# Patient Record
Sex: Female | Born: 1965
Health system: Southern US, Community
[De-identification: ages and names within clinical notes are randomized; demographics above are authoritative.]

## PROBLEM LIST (undated history)

## (undated) DIAGNOSIS — D649 Anemia, unspecified: Secondary | ICD-10-CM

## (undated) DIAGNOSIS — K649 Unspecified hemorrhoids: Secondary | ICD-10-CM

## (undated) DIAGNOSIS — N393 Stress incontinence (female) (male): Secondary | ICD-10-CM

## (undated) DIAGNOSIS — T7840XA Allergy, unspecified, initial encounter: Secondary | ICD-10-CM

## (undated) DIAGNOSIS — K219 Gastro-esophageal reflux disease without esophagitis: Secondary | ICD-10-CM

## (undated) HISTORY — PX: OTHER SURGICAL HISTORY: SHX169

## (undated) HISTORY — DX: Unspecified hemorrhoids: K64.9

## (undated) HISTORY — DX: Anemia, unspecified: D64.9

## (undated) HISTORY — PX: HEMORRHOID BANDING: SHX5850

## (undated) HISTORY — DX: Allergy, unspecified, initial encounter: T78.40XA

## (undated) HISTORY — PX: WISDOM TOOTH EXTRACTION: SHX21

## (undated) HISTORY — DX: Stress incontinence (female) (male): N39.3

## (undated) HISTORY — DX: Gastro-esophageal reflux disease without esophagitis: K21.9

---

## 1997-08-31 ENCOUNTER — Encounter: Admission: RE | Admit: 1997-08-31 | Discharge: 1997-11-10 | Payer: Self-pay | Admitting: *Deleted

## 1999-06-25 ENCOUNTER — Encounter (INDEPENDENT_AMBULATORY_CARE_PROVIDER_SITE_OTHER): Payer: Self-pay

## 1999-06-25 ENCOUNTER — Ambulatory Visit (HOSPITAL_COMMUNITY): Admission: RE | Admit: 1999-06-25 | Discharge: 1999-06-25 | Payer: Self-pay | Admitting: Obstetrics and Gynecology

## 2000-11-04 ENCOUNTER — Inpatient Hospital Stay (HOSPITAL_COMMUNITY): Admission: AD | Admit: 2000-11-04 | Discharge: 2000-11-06 | Payer: Self-pay | Admitting: Obstetrics and Gynecology

## 2000-12-12 ENCOUNTER — Other Ambulatory Visit: Admission: RE | Admit: 2000-12-12 | Discharge: 2000-12-12 | Payer: Self-pay | Admitting: Obstetrics and Gynecology

## 2002-08-29 ENCOUNTER — Encounter: Payer: Self-pay | Admitting: Obstetrics and Gynecology

## 2002-08-29 ENCOUNTER — Encounter: Admission: RE | Admit: 2002-08-29 | Discharge: 2002-08-29 | Payer: Self-pay | Admitting: Obstetrics and Gynecology

## 2003-07-21 ENCOUNTER — Encounter: Admission: RE | Admit: 2003-07-21 | Discharge: 2003-07-21 | Payer: Self-pay | Admitting: Obstetrics and Gynecology

## 2004-09-30 ENCOUNTER — Encounter: Admission: RE | Admit: 2004-09-30 | Discharge: 2004-09-30 | Payer: Self-pay | Admitting: Obstetrics and Gynecology

## 2005-03-23 ENCOUNTER — Encounter: Admission: RE | Admit: 2005-03-23 | Discharge: 2005-03-23 | Payer: Self-pay | Admitting: Obstetrics and Gynecology

## 2005-12-01 ENCOUNTER — Encounter: Admission: RE | Admit: 2005-12-01 | Discharge: 2005-12-01 | Payer: Self-pay | Admitting: Obstetrics and Gynecology

## 2006-08-08 ENCOUNTER — Encounter: Admission: RE | Admit: 2006-08-08 | Discharge: 2006-08-08 | Payer: Self-pay | Admitting: Obstetrics and Gynecology

## 2007-01-10 ENCOUNTER — Encounter: Admission: RE | Admit: 2007-01-10 | Discharge: 2007-01-10 | Payer: Self-pay | Admitting: Obstetrics and Gynecology

## 2007-08-28 ENCOUNTER — Ambulatory Visit: Payer: Self-pay | Admitting: Thoracic Surgery

## 2007-08-28 ENCOUNTER — Encounter: Admission: RE | Admit: 2007-08-28 | Discharge: 2007-08-28 | Payer: Self-pay | Admitting: Thoracic Surgery

## 2007-08-29 ENCOUNTER — Ambulatory Visit: Payer: Self-pay | Admitting: Thoracic Surgery

## 2008-02-03 ENCOUNTER — Encounter: Admission: RE | Admit: 2008-02-03 | Discharge: 2008-02-03 | Payer: Self-pay | Admitting: Obstetrics and Gynecology

## 2008-07-31 HISTORY — PX: TUMOR REMOVAL: SHX12

## 2009-04-06 ENCOUNTER — Encounter: Admission: RE | Admit: 2009-04-06 | Discharge: 2009-04-06 | Payer: Self-pay | Admitting: Obstetrics and Gynecology

## 2010-08-16 ENCOUNTER — Encounter
Admission: RE | Admit: 2010-08-16 | Discharge: 2010-08-16 | Payer: Self-pay | Source: Home / Self Care | Attending: Obstetrics and Gynecology | Admitting: Obstetrics and Gynecology

## 2010-12-13 NOTE — Letter (Signed)
August 28, 2007   Coletta Memos, M.D.  [address]  [address]  [place], [place] [address]   Re:  Dana Bernard, Dana Bernard               DOB:  Aug 12, 1965   Dear Dana Bernard:   I saw Dana Bernard today.  I appreciate the opportunity of seeing this  45 year old Caucasian female.  She was getting worked up for a chronic  cough, and chest x-ray showed a right lung mass.  CT scan revealed she  had a T5 posterior mediastinal tumor.  MRI shows a T5 posterior  mediastinal tumor, probably a schwannoma.  There is some impingement in  the vertebral in the neuroforamen.  She has no symptoms of paresthesias,  pain or any neurological weakness.  Her pulmonary symptom has been the  chronic cough for which she has seen an ENT and started on Protonix.  She also has been treated with an episode of steroids for possible  allergic bronchitis.   MEDICATIONS:  Her medications include Protonix.   ALLERGIES:  Prilosec causes a rash.   She was previously on another medication, which has been stopped.  She  has had no fever, chills, excessive sputum.   PAST MEDICAL HISTORY:  Significant that she has a left bundle branch  block.   FAMILY HISTORY:  Noncontributory.   SOCIAL HISTORY:  She is married and has two children.  Works as a Metallurgist.  Does not smoke, but drinks five or six glasses of wine per  week.   REVIEW OF SYSTEMS:  She weighs 127 Pounds.  She is 5 feet 7 inches.  CARDIAC:  No angina or atrial fibrillation.  PULMONARY:  See History of Present Illness.  GI:  No history of reflux.  No nausea, vomiting, constipation.  GU:  No kidney disease, dysuria or frequent urination.  VASCULAR:  No claudication, deep vein thrombosis, TIAs.  NEUROLOGICAL:  No headaches, blackouts or seizures.  MUSCULOSKELETAL:  No rash or joint pain.  PSYCHIATRIC:  No psychiatric illness.  EYES/ENT:  No change in her eyesight or her hearing.  HEMOLOGIC:  No positive bleeding or clotting disorders.   PHYSICAL EXAMINATION:  She  is a well-developed, Caucasian female in no  acute distress.  Her blood pressure is 128/86.  Pulse 80.  Respirations  18.  Saturations are 95%.  HEENT:  Unremarkable.  NECK:  Supple without thyromegaly.  There is no supraclavicular or axial  adenopathy.  CHEST:  Clear to auscultation and percussion.  HEART:  Regular sinus rhythm.  No murmur.  ABDOMEN:  Soft.  There is no hepatosplenomegaly.  EXTREMITIES:  Pulses 2+.  There is no clubbing or edema.  NEUROLOGICAL:  Intact.  SKIN:  Without lesions.   I reviewed the MRI and I am afraid there is a neurogenic impression.  This will probably require a combined approach with both a chest and  posterior approach.  I plan to get a CT scan of the chest before making  a final recommendation as I think the CT gives [place] another view of the  same situation.   I appreciate the opportunity of seeing Dana Bernard.   Ines Bloomer, M.D.  Electronically Signed   DPB/MEDQ  D:  08/28/2007  T:  08/28/2007  Job:  [id number]   cc:   Greer Pickerel, M.D.

## 2010-12-13 NOTE — Letter (Signed)
August 29, 2007   Coletta Memos, MD  [address]  [address]  [place], [place]  [address]   Re:  Dana, Bernard               DOB:  09-16-1965   Dear [name]:   Dana Bernard came today and we had a long discussion.  I reviewed her  CT scan which we got yesterday and also I reviewed her MRI and I feel  that she has a dumbbell-shaped schwannoma that I feel needs to be  removed with combined posterior and chest approach.  I have explained to  her in great detail my recommendations and will call you next week to  discuss it with you.  She is still trying to make up her mind where she  wants to have the surgery, but I did explain to her the risks of the  surgery including hemorrhage and paraparesis or  paraplegia, infection,  and pneumonia.  But I also explained to her that if she does not have  the surgery that she will eventually have problems with spinal cord  compression.  Again I will discuss this with you next week and then give  her a call.  Her blood pressure is 121/81, pulse 88, respirations 18.  Sats are 99%.   Ines Bloomer, M.D.  Electronically Signed   DPB/MEDQ  D:  08/29/2007  T:  08/29/2007  Job:  [id number]

## 2010-12-16 NOTE — H&P (Signed)
[facility]  Patient:    Dana Bernard, Dana Bernard                      MRN: [id number] Adm. Date:  11/04/00 Attending:  Lenoard Aden, M.D. [facility]   History and Physical  CHIEF COMPLAINT:              Postdates and presumed LGA, for induction.  HISTORY OF PRESENT ILLNESS:   The patient is a 45 year old white female, G5, P1-0-3-1, EDD October 28, 2000, at 41 weeks, who presents for induction for aforementioned reasons.  ALLERGIES:                    PRILOSEC.  MEDICATIONS:                  Prenatal vitamins.  OBSTETRICAL HISTORY:          Uncomplicated SAB in 1997.  Missed AB in 1992, with D&C.  A 6-pound 5-ounce female born in 57.  Spontaneous pregnancy loss in November 2000, with D&C.  The patients first term of pregnancy complicated pregnancy-induced hypertension.  GYNECOLOGICAL HISTORY:        Cryosurgery.  Previous pregnancy complicated by preterm labor.  Current pregnancy complicated by preterm cervical change without medications.  FAMILY HISTORY:               Asthma, hypertension, diabetes, lung cancer, and alcoholism.  PRENATAL LABORATORY DATA:     Blood type B positive, Rh antibody negative. Rubella immune.  Hepatitis B surface antigen negative.  HIV nonreactive. Group B strep negative.  Triple screen normal.  The patient had a 20-week ultrasound which was consistent with a normal anatomical survey, normal cervical length, and anterior placenta.  PHYSICAL EXAMINATION:  GENERAL:                      Well-developed, well-nourished white female in no apparent distress.  HEENT:                        Normal.  LUNGS:                        Clear.  HEART:                        Regular rhythm.  ABDOMEN:                      Soft, gravid, nontender.  Estimated fetal weight 8 pounds.  PELVIC:                       Cervix is 3 cm, 70%, vertex, -1 to 0 station. Artificial rupture of membranes  clear.  EXTREMITIES:                  No cords.  NEUROLOGIC:                   Nonfocal.  IMPRESSION:                   1. Postdates intrauterine pregnancy.                               2. Presumed large for gestational age.  3. History of postpartum hemorrhage.                               4. History of preterm labor.  PLAN:                         Proceed with artificial rupture of membranes, Pitocin augmentation if needed.  Epidural if needed.  Anticipated attempts at vaginal delivery. DD:  11/04/00 TD:  11/04/00 Job: [id number]

## 2010-12-16 NOTE — Op Note (Signed)
[facility]  Patient:    Dana Bernard                       MRN: [id number] Proc. Date: 06/25/99 Adm. Date:  [id number] Attending:  Lenoard Aden CC:         [facility]                           Operative Report  PREOPERATIVE DIAGNOSIS:       Fetal demise at 12 weeks.  POSTOPERATIVE DIAGNOSIS:      Fetal demise at 12 weeks.  OPERATION:                    Suction dilatation and curettage, examine under anesthesia.  SURGEON:                      Lenoard Aden, M.D.  ANESTHESIA:                   MAC and paracervical.  ESTIMATED BLOOD LOSS:         100 cc.  COMPLICATIONS:                None.  DRAINS:                       None.  COUNTS:                       Correct.  DISPOSITION:                  To recovery in good condition with no complications.  DESCRIPTION OF PROCEDURE:     After being apprised of the risks of anesthesia, infection, bleeding, uterine perforation, need for repair, the patient was brought to the operating room where she was administered a IV sedative without complications.  Prepped and draped in the usual sterile fashion.  Examination under anesthesia revealed a 12-week sized retroflexed uterus.  No adnexa masses at this time.  Paracervical block is placed after the patient is prepped and draped in the usual sterile fashion and catheterized until the bladder is emptied.  Then 20 cc of a 2% Xylocaine solution was used.  Uterus sounded to 15 cm, dilated up with a #36 dilator.  A 12 mm curved suction  curet was placed.  Suction revealing productive of conception as noted and sent to pathology for confirmation.  One curettage and four quadrant method confirms cavity to be emptied.  Repeat suction and curettage reconfirms good hemostasis noted.  All instruments were removed.  The patient tolerated the procedure well and goes to recovery in good condition. DD:  06/25/99 TD:  06/25/99 Job:  [id number]

## 2011-09-28 ENCOUNTER — Other Ambulatory Visit: Payer: Self-pay | Admitting: Family Medicine

## 2011-09-28 DIAGNOSIS — Z1231 Encounter for screening mammogram for malignant neoplasm of breast: Secondary | ICD-10-CM

## 2011-10-09 ENCOUNTER — Ambulatory Visit
Admission: RE | Admit: 2011-10-09 | Discharge: 2011-10-09 | Disposition: A | Discharge status: Home / Self Care | Payer: Commercial Indemnity | Source: Ambulatory Visit | Attending: Family Medicine | Admitting: Family Medicine

## 2011-10-09 DIAGNOSIS — Z1231 Encounter for screening mammogram for malignant neoplasm of breast: Secondary | ICD-10-CM

## 2011-11-09 DIAGNOSIS — R102 Pelvic and perineal pain unspecified side: Secondary | ICD-10-CM | POA: Insufficient documentation

## 2011-11-09 DIAGNOSIS — D166 Benign neoplasm of vertebral column: Secondary | ICD-10-CM | POA: Insufficient documentation

## 2012-09-20 ENCOUNTER — Other Ambulatory Visit: Payer: Self-pay | Admitting: Obstetrics and Gynecology

## 2012-10-18 ENCOUNTER — Ambulatory Visit
Admission: RE | Admit: 2012-10-18 | Discharge: 2012-10-18 | Disposition: A | Discharge status: Home / Self Care | Payer: Commercial Indemnity | Source: Ambulatory Visit | Attending: Obstetrics and Gynecology | Admitting: Obstetrics and Gynecology

## 2012-10-18 DIAGNOSIS — Z1231 Encounter for screening mammogram for malignant neoplasm of breast: Secondary | ICD-10-CM

## 2013-12-01 ENCOUNTER — Other Ambulatory Visit: Payer: Self-pay

## 2013-12-01 DIAGNOSIS — Z1231 Encounter for screening mammogram for malignant neoplasm of breast: Secondary | ICD-10-CM

## 2013-12-26 ENCOUNTER — Encounter (INDEPENDENT_AMBULATORY_CARE_PROVIDER_SITE_OTHER): Payer: Self-pay

## 2013-12-26 ENCOUNTER — Ambulatory Visit
Admission: RE | Admit: 2013-12-26 | Discharge: 2013-12-26 | Disposition: A | Discharge status: Home / Self Care | Payer: Commercial Indemnity | Source: Ambulatory Visit

## 2013-12-26 DIAGNOSIS — Z1231 Encounter for screening mammogram for malignant neoplasm of breast: Secondary | ICD-10-CM

## 2014-02-25 ENCOUNTER — Encounter: Payer: Self-pay | Admitting: Internal Medicine

## 2014-04-02 ENCOUNTER — Encounter: Payer: Self-pay | Admitting: Gastroenterology

## 2014-05-11 ENCOUNTER — Encounter: Payer: Self-pay | Admitting: Internal Medicine

## 2014-05-11 ENCOUNTER — Ambulatory Visit (INDEPENDENT_AMBULATORY_CARE_PROVIDER_SITE_OTHER): Payer: Commercial Indemnity | Admitting: Internal Medicine

## 2014-05-11 VITALS — BP 136/88 | HR 72 | Ht 67.0 in | Wt 157.8 lb

## 2014-05-11 DIAGNOSIS — K644 Residual hemorrhoidal skin tags: Secondary | ICD-10-CM

## 2014-05-11 DIAGNOSIS — Z1211 Encounter for screening for malignant neoplasm of colon: Secondary | ICD-10-CM

## 2014-05-11 DIAGNOSIS — K648 Other hemorrhoids: Secondary | ICD-10-CM

## 2014-05-11 NOTE — Assessment & Plan Note (Signed)
She will need a colonoscopy in early 2016, probably February. I believe her problems are related to symptomatic hemorrhoids I don't think a colonoscopy is needed at this time but definitely in early 2016.

## 2014-05-11 NOTE — Assessment & Plan Note (Signed)
Small anal tags Try Balneol, see response to banding - may shrink some - she has itching here mostly Advised using hair dryer after bathing

## 2014-05-11 NOTE — Patient Instructions (Signed)
HEMORRHOID BANDING PROCEDURE    FOLLOW-UP CARE   1. The procedure you have had should have been relatively painless since the banding of the area involved does not have nerve endings and there is no pain sensation.  The rubber band cuts off the blood supply to the hemorrhoid and the band may fall off as soon as 48 hours after the banding (the band may occasionally be seen in the toilet bowl following a bowel movement). You may notice a temporary feeling of fullness in the rectum which should respond adequately to plain Tylenol or Motrin.  2. Following the banding, avoid strenuous exercise that evening and resume full activity the next day.  A sitz bath (soaking in a warm tub) or bidet is soothing, and can be useful for cleansing the area after bowel movements.     3. To avoid constipation, take two tablespoons of natural wheat bran, natural oat bran, flax, Benefiber or any over the counter fiber supplement and increase your water intake to 7-8 glasses daily.    4. Unless you have been prescribed anorectal medication, do not put anything inside your rectum for two weeks: No suppositories, enemas, fingers, etc.  5. Occasionally, you may have more bleeding than usual after the banding procedure.  This is often from the untreated hemorrhoids rather than the treated one.  Don't be concerned if there is a tablespoon or so of blood.  If there is more blood than this, lie flat with your bottom higher than your head and apply an ice pack to the area. If the bleeding does not stop within a half an hour or if you feel faint, call our office at [phone] or go to the emergency room.  6. Problems are not common; however, if there is a substantial amount of bleeding, severe pain, chills, fever or difficulty passing urine (very rare) or other problems, you should call us at [phone] or report to the nearest emergency room.  7. Do not stay seated continuously for more than 2-3 hours for a day or two  after the procedure.  Tighten your buttock muscles 10-15 times every two hours and take 10-15 deep breaths every 1-2 hours.  Do not spend more than a few minutes on the toilet if you cannot empty your bowel; instead re-visit the toilet at a later time.  You have been scheduled for banding #2 on 06/02/14 at 2:15 pm. CC:  Jefm Petty MD

## 2014-05-11 NOTE — Assessment & Plan Note (Signed)
RP column banded RTC 2-4 weeks

## 2014-05-11 NOTE — Progress Notes (Signed)
Referred by Dr. Arvella Nigh Subjective:    Patient ID: Dana Bernard, female    DOB: 12-May-1966, 49 y.o.   MRN: [id number]  HPI Nice 48 year old married woman with a long history of hemorrhoids since childbirth, they intermittently bother her. She had an episode this summer with a lot of swelling and eructation and slight bleeding. For hemorrhoids tender prolapse. She moves her bowels regularly but not every day. Denies significant straining to stool. This summer she used hydrocortisone cream with some help but had persistent problems and asked to be seen about possible treatment of hemorrhoids here. Currently she is in these in shape with some occasional intermittent itching. No bleeding. She will turns 49 in January and would be due for a routine screening colonoscopy. Allergies  Allergen Reactions  . Prilosec [Omeprazole] Hives   Meds ordered this encounter  Medications  . venlafaxine XR (EFFEXOR-XR) 37.5 MG 24 hr capsule    Sig:   . PROCTOZONE-HC 2.5 % rectal cream    Sig:        Past Medical History  Diagnosis Date  . Hemorrhoids   . SUI (stress urinary incontinence, female)    Past Surgical History  Procedure Laterality Date  . Tumor removal  2010    of Spine-benign   History   Social History  . Marital Status: Married    Spouse Name: N/A    Number of Children: 2  . Years of Education: N/A   Occupational History  . Sales    Social History Main Topics  . Smoking status: Never Smoker   . Smokeless tobacco: Never Used  . Alcohol Use: Yes     Comment: 1 per day  . Drug Use: No  . Sexual Activity:  yes           Social History Narrative   Married, 1 son and 1 daughter   [employer], broadcasting/film/video dairy products   2 caffeine beverages daily   Family History  Problem Relation Age of Onset  . Brain cancer Father   . Stomach cancer Mother    Review of Systems All other ROS negative except for some stress urinary incontinence and intermittent vaginal dryness   Objective:   Physical Exam General:  Well-developed, well-nourished and in no acute distress Eyes:  anicteric. Abdomen:  soft, non-tender, no hepatosplenomegaly, hernia, or mass and BS+.  Rectal: With female staff present revealed small diffuse anal tag, otherwise normal anoderm and intact anal wink. There is no mass or rectocele. There is good resting and voluntary tone and squeeze and appropriate descent and simulated defecation along with appropriate abdominal contraction. Neuro:  A&O x 3.  Psych:  appropriate mood and  Affect.   Data Reviewed: Gynecology notes from February 2015 as well as phone call record of July 2015.  PROCEDURE NOTE: The patient presents with symptomatic grade 2  hemorrhoids, requesting rubber band ligation of his/her hemorrhoidal disease.  All risks, benefits and alternative forms of therapy were described and informed consent was obtained.  Lori Hvozdovic PA-C present for exams and procedures  In the Left Lateral Decubitus position anoscopic examination revealed grade 2 hemorrhoids in the all position(s).   The decision was made to band the RP internal hemorrhoid, and the [place] was used to perform band ligation without complication.  Digital anorectal examination was then performed to assure proper positioning of the band, and to adjust the banded tissue as required. 5% lidocaine and 0.125% NTG applied to alleviate sxs of  need to defecate also  The patient was discharged home without pain or other issues.  Dietary and behavioral recommendations were given and along with follow-up instructions.     The following adjunctive treatments were recommended:  Benefiber 1 tbsp daily and Balneol when necessary  The patient will return in 2-4 weeks for  follow-up and possible additional banding as required. No complications were encountered and the patient tolerated the procedure well.     Assessment & Plan:  Anal skin tags Small anal tags Try  Balneol, see response to banding - may shrink some - she has itching here mostly Advised using hair dryer after bathing  Hemorrhoids, internal, with bleeding, prolapse and itching RP column banded RTC 2-4 weeks  Colon cancer screening She will need a colonoscopy in early 2016, probably February. I believe her problems are related to symptomatic hemorrhoids I don't think a colonoscopy is needed at this time but definitely in early 2016.   I appreciate the opportunity to care for this patient. I will send a copy to Dr. Arvella Nigh and to Dr. Jefm Petty

## 2014-06-02 ENCOUNTER — Ambulatory Visit (INDEPENDENT_AMBULATORY_CARE_PROVIDER_SITE_OTHER): Payer: Commercial Indemnity | Admitting: Internal Medicine

## 2014-06-02 ENCOUNTER — Encounter: Payer: Self-pay | Admitting: Internal Medicine

## 2014-06-02 VITALS — BP 134/68 | HR 88 | Ht 67.0 in | Wt 158.0 lb

## 2014-06-02 DIAGNOSIS — K648 Other hemorrhoids: Secondary | ICD-10-CM

## 2014-06-02 NOTE — Progress Notes (Signed)
Patient ID: Dana Bernard, female   DOB: September 28, 1965, 48 y.o.   MRN: [id number]         PROCEDURE NOTE: The patient presents with symptomatic grade 2  hemorrhoids, requesting rubber band ligation of his/her hemorrhoidal disease.  All risks, benefits and alternative forms of therapy were described and informed consent was obtained.   The anorectum was pre-medicated with 0.125% NTG and lidocaine 5% The decision was made to band the LL internal hemorrhoid, and the [place] was used to perform band ligation without complication.  Digital anorectal examination was then performed to assure proper positioning of the band, and to adjust the banded tissue as required.  The patient was discharged home without pain or other issues.  Dietary and behavioral recommendations were given and along with follow-up instructions.     The following adjunctive treatments were recommended:  Balneol, Recticare prn  The patient will return in 2-4 weeks for  follow-up and possible additional banding as required. No complications were encountered and the patient tolerated the procedure well.

## 2014-06-02 NOTE — Patient Instructions (Addendum)
HEMORRHOID BANDING PROCEDURE    FOLLOW-UP CARE   1. The procedure you have had should have been relatively painless since the banding of the area involved does not have nerve endings and there is no pain sensation.  The rubber band cuts off the blood supply to the hemorrhoid and the band may fall off as soon as 48 hours after the banding (the band may occasionally be seen in the toilet bowl following a bowel movement). You may notice a temporary feeling of fullness in the rectum which should respond adequately to plain Tylenol or Motrin.  2. Following the banding, avoid strenuous exercise that evening and resume full activity the next day.  A sitz bath (soaking in a warm tub) or bidet is soothing, and can be useful for cleansing the area after bowel movements.     3. To avoid constipation, take two tablespoons of natural wheat bran, natural oat bran, flax, Benefiber or any over the counter fiber supplement and increase your water intake to 7-8 glasses daily.    4. Unless you have been prescribed anorectal medication, do not put anything inside your rectum for two weeks: No suppositories, enemas, fingers, etc.  5. Occasionally, you may have more bleeding than usual after the banding procedure.  This is often from the untreated hemorrhoids rather than the treated one.  Don't be concerned if there is a tablespoon or so of blood.  If there is more blood than this, lie flat with your bottom higher than your head and apply an ice pack to the area. If the bleeding does not stop within a half an hour or if you feel faint, call our office at [phone] or go to the emergency room.  6. Problems are not common; however, if there is a substantial amount of bleeding, severe pain, chills, fever or difficulty passing urine (very rare) or other problems, you should call us at [phone] or report to the nearest emergency room.  7. Do not stay seated continuously for more than 2-3 hours for a day or two  after the procedure.  Tighten your buttock muscles 10-15 times every two hours and take 10-15 deep breaths every 1-2 hours.  Do not spend more than a few minutes on the toilet if you cannot empty your bowel; instead re-visit the toilet at a later time.    We have made your next appointment for : November 20th, at 3:45pm.    I appreciate the opportunity to care for you.

## 2014-06-02 NOTE — Assessment & Plan Note (Addendum)
LL banded RTC 2-3 weeks

## 2014-06-19 ENCOUNTER — Encounter: Payer: Self-pay | Admitting: Internal Medicine

## 2014-06-19 ENCOUNTER — Ambulatory Visit (INDEPENDENT_AMBULATORY_CARE_PROVIDER_SITE_OTHER): Payer: Commercial Indemnity | Admitting: Internal Medicine

## 2014-06-19 VITALS — BP 126/68 | HR 84 | Ht 67.0 in | Wt 158.4 lb

## 2014-06-19 DIAGNOSIS — K648 Other hemorrhoids: Secondary | ICD-10-CM

## 2014-06-19 NOTE — Assessment & Plan Note (Signed)
RA banded She will observe over next 2 months If persistent sxs of itching and irritation she would benefit from surgical evaluation of the anal tags and external hemorrhoid component

## 2014-06-19 NOTE — Progress Notes (Signed)
Patient ID: Dana Bernard, female   DOB: 1966-03-15, 48 y.o.   MRN: [id number]          PROCEDURE NOTE: The patient presents with symptomatic grade 2  hemorrhoids, requesting rubber band ligation of his/her hemorrhoidal disease.  All risks, benefits and alternative forms of therapy were described and informed consent was obtained.   The anorectum was pre-medicated with 0.125% NTG and 5% lidocaine The decision was made to band the RA internal hemorrhoid, and the [place] was used to perform band ligation without complication.  Digital anorectal examination was then performed to assure proper positioning of the band, and to adjust the banded tissue as required.  The patient was discharged home without pain or other issues.  Dietary and behavioral recommendations were given and along with follow-up instructions.     The following adjunctive treatments were recommended:  Benefiber Balneol Recticare  The patient will return in as needed for follow-up and possible additional banding as required. No complications were encountered and the patient tolerated the procedure well.  She will call in 2016 to schedule a colonoscopy for screening   I appreciate the opportunity to care for this patient. Gatha Mayer, MD, [facility] [id number], Christian Mate, MD

## 2014-06-19 NOTE — Patient Instructions (Signed)
HEMORRHOID BANDING PROCEDURE    FOLLOW-UP CARE   1. The procedure you have had should have been relatively painless since the banding of the area involved does not have nerve endings and there is no pain sensation.  The rubber band cuts off the blood supply to the hemorrhoid and the band may fall off as soon as 48 hours after the banding (the band may occasionally be seen in the toilet bowl following a bowel movement). You may notice a temporary feeling of fullness in the rectum which should respond adequately to plain Tylenol or Motrin.  2. Following the banding, avoid strenuous exercise that evening and resume full activity the next day.  A sitz bath (soaking in a warm tub) or bidet is soothing, and can be useful for cleansing the area after bowel movements.     3. To avoid constipation, take two tablespoons of natural wheat bran, natural oat bran, flax, Benefiber or any over the counter fiber supplement and increase your water intake to 7-8 glasses daily.    4. Unless you have been prescribed anorectal medication, do not put anything inside your rectum for two weeks: No suppositories, enemas, fingers, etc.  5. Occasionally, you may have more bleeding than usual after the banding procedure.  This is often from the untreated hemorrhoids rather than the treated one.  Don't be concerned if there is a tablespoon or so of blood.  If there is more blood than this, lie flat with your bottom higher than your head and apply an ice pack to the area. If the bleeding does not stop within a half an hour or if you feel faint, call our office at [phone] or go to the emergency room.  6. Problems are not common; however, if there is a substantial amount of bleeding, severe pain, chills, fever or difficulty passing urine (very rare) or other problems, you should call us at [phone] or report to the nearest emergency room.  7. Do not stay seated continuously for more than 2-3 hours for a day or two  after the procedure.  Tighten your buttock muscles 10-15 times every two hours and take 10-15 deep breaths every 1-2 hours.  Do not spend more than a few minutes on the toilet if you cannot empty your bowel; instead re-visit the toilet at a later time.    Follow up with Dr. Carlean Purl as needed.  Call to set up a colonoscopy in 2016 when ready.    I appreciate the opportunity to care for you.

## 2014-09-14 ENCOUNTER — Encounter: Payer: Self-pay | Admitting: Internal Medicine

## 2015-02-12 ENCOUNTER — Other Ambulatory Visit: Payer: Self-pay | Admitting: Obstetrics and Gynecology

## 2015-02-12 ENCOUNTER — Other Ambulatory Visit: Payer: Self-pay

## 2015-02-12 DIAGNOSIS — Z1231 Encounter for screening mammogram for malignant neoplasm of breast: Secondary | ICD-10-CM

## 2015-03-18 ENCOUNTER — Ambulatory Visit
Admission: RE | Admit: 2015-03-18 | Discharge: 2015-03-18 | Disposition: A | Discharge status: Home / Self Care | Payer: Commercial Indemnity | Source: Ambulatory Visit

## 2015-03-18 DIAGNOSIS — Z1231 Encounter for screening mammogram for malignant neoplasm of breast: Secondary | ICD-10-CM

## 2015-09-08 ENCOUNTER — Encounter: Payer: Self-pay | Admitting: Internal Medicine

## 2015-10-25 ENCOUNTER — Ambulatory Visit (AMBULATORY_SURGERY_CENTER): Payer: Self-pay | Admitting: *Deleted

## 2015-10-25 VITALS — Ht 67.0 in | Wt 162.8 lb

## 2015-10-25 DIAGNOSIS — Z1211 Encounter for screening for malignant neoplasm of colon: Secondary | ICD-10-CM

## 2015-10-25 NOTE — Progress Notes (Signed)
No egg or soy allergy known to patient  No issues with past sedation with any surgeries  or procedures, no intubation problems  No diet pills per patient No home 02 use per patient  No blood thinners per patient  Pt denies issues with constipation  emmi declined

## 2015-11-08 ENCOUNTER — Encounter: Payer: Self-pay | Admitting: Internal Medicine

## 2015-11-08 ENCOUNTER — Ambulatory Visit (AMBULATORY_SURGERY_CENTER): Payer: Managed Care, Other (non HMO) | Admitting: Internal Medicine

## 2015-11-08 VITALS — BP 129/83 | HR 89 | Temp 98.0°F | Resp 14 | Ht 67.0 in | Wt 162.0 lb

## 2015-11-08 DIAGNOSIS — Z1211 Encounter for screening for malignant neoplasm of colon: Secondary | ICD-10-CM | POA: Diagnosis present

## 2015-11-08 MED ORDER — SODIUM CHLORIDE 0.9 % IV SOLN: 500.0000 mL | INTRAVENOUS | Status: DC

## 2015-11-08 NOTE — Progress Notes (Signed)
Report to PACU, RN, vss, BBS= Clear.  

## 2015-11-08 NOTE — Op Note (Signed)
[place] Patient Name: Dana Bernard Procedure Date: 11/08/2015 7:56 AM MRN: [id number] Endoscopist: Gatha Mayer , MD Age: 50 Date of Birth: 06-13-1966 Gender: Female Procedure:                Colonoscopy Indications:              Screening for colorectal malignant neoplasm Medicines:                Propofol per Anesthesia, Monitored Anesthesia Care Procedure:                Pre-Anesthesia Assessment:                           - Prior to the procedure, a History and Physical                            was performed, and patient medications and                            allergies were reviewed. The patient's tolerance of                            previous anesthesia was also reviewed. The risks                            and benefits of the procedure and the sedation                            options and risks were discussed with the patient.                            All questions were answered, and informed consent                            was obtained. Prior Anticoagulants: The patient has                            taken no previous anticoagulant or antiplatelet                            agents. ASA Grade Assessment: II - A patient with                            mild systemic disease. After reviewing the risks                            and benefits, the patient was deemed in                            satisfactory condition to undergo the procedure.                           After obtaining informed consent, the colonoscope  was passed under direct vision. Throughout the                            procedure, the patient's blood pressure, pulse, and                            oxygen saturations were monitored continuously. The                            Model PCF-H190L [id number]) scope was introduced                            through the anus and advanced to the the cecum,                            identified by appendiceal  orifice and ileocecal                            valve. The colonoscopy was performed without                            difficulty. The patient tolerated the procedure                            well. The quality of the bowel preparation was                            excellent. The bowel preparation used was Miralax.                            The ileocecal valve, appendiceal orifice, and                            rectum were photographed. Scope In: 8:15:07 AM Scope Out: 8:28:42 AM Scope Withdrawal Time: 0 hours 9 minutes 46 seconds  Total Procedure Duration: 0 hours 13 minutes 35 seconds  Findings:                 The perianal exam findings include skin tags.                           The digital rectal exam was normal.                           Non-bleeding external hemorrhoids were found during                            retroflexion. The hemorrhoids were small. Also saw                            scars from badning of internal hemorrhoids in past.                           The exam was otherwise without abnormality on  direct and retroflexion views. Complications:            No immediate complications. Estimated Blood Loss:     Estimated blood loss: none. Impression:               - Perianal skin tags found on perianal exam.                           - Non-bleeding external hemorrhoids and scars from                            prior banding of internal hemorrhoids.                           - The examination was otherwise normal on direct                            and retroflexion views.                           - No specimens collected. Recommendation:           - Patient has a contact number available for                            emergencies. The signs and symptoms of potential                            delayed complications were discussed with the                            patient. Return to normal activities tomorrow.                             Written discharge instructions were provided to the                            patient.                           - Continue present medications.                           - Resume previous diet.                           - Repeat colonoscopy in 10 years for screening                            purposes. Gatha Mayer, MD 11/08/2015 8:39:18 AM This report has been signed electronically. CC Letter to:             Arvella Nigh, MD, Jefm Petty, MD

## 2015-11-08 NOTE — Progress Notes (Signed)
Patient denies any allergies to eggs or soy. 

## 2015-11-08 NOTE — Patient Instructions (Addendum)
No polyps, no cancer seen. Next routine colonoscopy in 10 years - 2027  I appreciate the opportunity to care for you. Gatha Mayer, MD, FACG   YOU HAD AN ENDOSCOPIC PROCEDURE TODAY AT [facility]:   Refer to the procedure report that was given to you for any specific questions about what was found during the examination.  If the procedure report does not answer your questions, please call your gastroenterologist to clarify.  If you requested that your care partner not be given the details of your procedure findings, then the procedure report has been included in a sealed envelope for you to review at your convenience later.  YOU SHOULD EXPECT: Some feelings of bloating in the abdomen. Passage of more gas than usual.  Walking can help get rid of the air that was put into your GI tract during the procedure and reduce the bloating. If you had a lower endoscopy (such as a colonoscopy or flexible sigmoidoscopy) you may notice spotting of blood in your stool or on the toilet paper. If you underwent a bowel prep for your procedure, you may not have a normal bowel movement for a few days.  Please Note:  You might notice some irritation and congestion in your nose or some drainage.  This is from the oxygen used during your procedure.  There is no need for concern and it should clear up in a day or so.  SYMPTOMS TO REPORT IMMEDIATELY:   Following lower endoscopy (colonoscopy or flexible sigmoidoscopy):  Excessive amounts of blood in the stool  Significant tenderness or worsening of abdominal pains  Swelling of the abdomen that is new, acute  Fever of 100F or higher   For urgent or emergent issues, a gastroenterologist can be reached at any hour by calling [phone].   DIET: Your first meal following the procedure should be a small meal and then it is ok to progress to your normal diet. Heavy or fried foods are harder to digest and may make you feel nauseous or  bloated.  Likewise, meals heavy in dairy and vegetables can increase bloating.  Drink plenty of fluids but you should avoid alcoholic beverages for 24 hours.  ACTIVITY:  You should plan to take it easy for the rest of today and you should NOT DRIVE or use heavy machinery until tomorrow (because of the sedation medicines used during the test).    FOLLOW UP: Our staff will call the number listed on your records the next business day following your procedure to check on you and address any questions or concerns that you may have regarding the information given to you following your procedure. If we do not reach you, we will leave a message.  However, if you are feeling well and you are not experiencing any problems, there is no need to return our call.  We will assume that you have returned to your regular daily activities without incident.  If any biopsies were taken you will be contacted by phone or by letter within the next 1-3 weeks.  Please call us at [phone] if you have not heard about the biopsies in 3 weeks.    SIGNATURES/CONFIDENTIALITY: You and/or your care partner have signed paperwork which will be entered into your electronic medical record.  These signatures attest to the fact that that the information above on your After Visit Summary has been reviewed and is understood.  Full responsibility of the confidentiality of this discharge information lies  with you and/or your care-partner.  Thank-you for choosing [place] for your healthcare needs today.

## 2015-11-09 ENCOUNTER — Telehealth: Payer: Self-pay

## 2015-11-09 NOTE — Telephone Encounter (Signed)
  Follow up Call-  Call back number 11/08/2015  Post procedure Call Back phone  # [phone]  Permission to leave phone message Yes     Patient questions:  Do you have a fever, pain , or abdominal swelling? No. Pain Score  0 *  Have you tolerated food without any problems? Yes.    Have you been able to return to your normal activities? Yes.    Do you have any questions about your discharge instructions: Diet   No. Medications  No. Follow up visit  No.  Do you have questions or concerns about your Care? No.  Actions: * If pain score is 4 or above: No action needed, pain <4.  No problems per the pt. maw

## 2016-04-10 ENCOUNTER — Other Ambulatory Visit: Payer: Self-pay | Admitting: Obstetrics and Gynecology

## 2016-04-10 DIAGNOSIS — Z1231 Encounter for screening mammogram for malignant neoplasm of breast: Secondary | ICD-10-CM

## 2016-04-28 ENCOUNTER — Ambulatory Visit: Payer: Managed Care, Other (non HMO)

## 2016-05-08 ENCOUNTER — Ambulatory Visit
Admission: RE | Admit: 2016-05-08 | Discharge: 2016-05-08 | Disposition: A | Discharge status: Home / Self Care | Payer: Managed Care, Other (non HMO) | Source: Ambulatory Visit | Attending: Obstetrics and Gynecology | Admitting: Obstetrics and Gynecology

## 2016-05-08 DIAGNOSIS — Z1231 Encounter for screening mammogram for malignant neoplasm of breast: Secondary | ICD-10-CM

## 2017-05-01 ENCOUNTER — Other Ambulatory Visit: Payer: Self-pay | Admitting: Obstetrics and Gynecology

## 2017-05-01 DIAGNOSIS — Z1231 Encounter for screening mammogram for malignant neoplasm of breast: Secondary | ICD-10-CM

## 2017-05-14 ENCOUNTER — Ambulatory Visit
Admission: RE | Admit: 2017-05-14 | Discharge: 2017-05-14 | Disposition: A | Discharge status: Home / Self Care | Payer: Managed Care, Other (non HMO) | Source: Ambulatory Visit | Attending: Obstetrics and Gynecology | Admitting: Obstetrics and Gynecology

## 2017-05-14 DIAGNOSIS — Z1231 Encounter for screening mammogram for malignant neoplasm of breast: Secondary | ICD-10-CM

## 2018-08-20 ENCOUNTER — Other Ambulatory Visit: Payer: Self-pay | Admitting: Obstetrics and Gynecology

## 2018-08-20 DIAGNOSIS — Z1231 Encounter for screening mammogram for malignant neoplasm of breast: Secondary | ICD-10-CM

## 2018-09-12 ENCOUNTER — Ambulatory Visit
Admission: RE | Admit: 2018-09-12 | Discharge: 2018-09-12 | Disposition: A | Discharge status: Home / Self Care | Payer: Managed Care, Other (non HMO) | Source: Ambulatory Visit | Attending: Obstetrics and Gynecology | Admitting: Obstetrics and Gynecology

## 2018-09-12 DIAGNOSIS — Z1231 Encounter for screening mammogram for malignant neoplasm of breast: Secondary | ICD-10-CM

## 2019-09-02 ENCOUNTER — Encounter: Payer: Self-pay | Admitting: Internal Medicine

## 2019-09-02 ENCOUNTER — Ambulatory Visit: Payer: Managed Care, Other (non HMO) | Admitting: Internal Medicine

## 2019-09-02 VITALS — BP 112/74 | HR 88 | Temp 98.6°F | Ht 67.0 in | Wt 160.1 lb

## 2019-09-02 DIAGNOSIS — K644 Residual hemorrhoidal skin tags: Secondary | ICD-10-CM | POA: Diagnosis not present

## 2019-09-02 MED ORDER — HYDROCORTISONE ACETATE 25 MG RE SUPP: 25.0000 mg | Freq: Every evening | RECTAL | 0 refills | Status: DC | PRN

## 2019-09-02 NOTE — Patient Instructions (Signed)
Try taking Benefiber 1-2 tablespoons each day and keep fluid intake up.  I have prescribed hydrocortisone suppositories to use - try taking nightly for a few days and up to a week but I bet they will help within in a few days.  If these measures fail to take care of things let me know.  Long Term Prevention of Recurrent Hemorrhoids:   1. Fiber - Western diets are typically deficient in dietary fiber, and the addition of 15 - 20 gm. of fiber will help you have stools of a proper consistency, limiting your need to strain.  In addition to the use of raw oat or wheat bran, there are a number of commercial preparations that are available (Metamucil, Benefiber and Citrucel are just a few).    2. Fluids - It is important to have a sufficient amount of water intake during the day, in part to help the fiber "do its job".  Unless you have a medical condition that would prohibit it, a minimum of 6 - 8 glasses per day is important to help keep a regular bowel movement.  3. Do not strain - Many experts feel that chronic straining is one of the causes for the development of hemorrhoids.  Trying to limit yourself to two minutes on the commode may well limit your risk of recurrent hemorrhoids.  Also, do not try to "hold it" or avoid going to the bathroom when the urge is there.  These behavioral changes are thought to be very helpful in maintaining good bowel health.  I appreciate the opportunity to care for you. Gatha Mayer, MD, [facility]

## 2019-09-02 NOTE — Progress Notes (Signed)
Dana Bernard 54 y.o. 03-29-1966 [id number]  Assessment & Plan:   Encounter Diagnosis  Name Primary?  . Bleeding external hemorrhoids Yes     We will treat with Anusol HC suppositories as needed.  I explained how the most important underlying treatment is regular defecation increasing fiber fluids she may get enough of those but increasing fiber will help.  Long-term steroid use is not encouraged.  She understands.  However her flare should come down with this.  Banding is not indicated here.  She asked about surgery and I explained how that is painful and she has just such minimal changes here even though they are a nuisance I doubt these are operable necessarily and am confident that my surgical colleagues would recommend lifestyle and diet changes first.  She will see me as needed.  I appreciate the opportunity to care for this patient. CC: Dana Petty, MD  Subjective:   Chief Complaint: hemorrhoids  HPI Patient is here for follow-up having some rectal bleeding and wanting reassessment of her hemorrhoids, she is status post banding of these hemorrhoids with good result in 2015 and then screening colonoscopy in 2017 that was negative.  She had done well but over the last year she is seeing occasional bright red blood when she wipes.  Usually after multiple wipes.  No itching.  No discomfort.  She has external hemorrhoid anal tag complexes that persist and feel a bit swollen.  Occasionally gets constipated when she does not eat regularly.  She feels like she has good fluid intake.  She walks for exercise no cycling or exercises that could irritate the anal area occ constipation  She has been using some over-the-counter hydrocortisone cream on the outside of her anal area.   Allergies  Allergen Reactions  . Prilosec [Omeprazole] Hives   Current Meds  Medication Sig  . ibuprofen (ADVIL,MOTRIN) 200 MG tablet Take 200 mg by mouth every 6 (six) hours as needed.   .  Venlafaxine HCl 37.5 MG TB24 Take 37.5 mg by mouth daily with breakfast.    Past Medical History:  Diagnosis Date  . Allergy   . Anemia    fater 1st child, no recent  . GERD (gastroesophageal reflux disease)   . Hemorrhoids   . SUI (stress urinary incontinence, female)    pt denies   Past Surgical History:  Procedure Laterality Date  . HEMORRHOID BANDING    . tooth removal     as child with sedation  . TUMOR REMOVAL  2010   of Spine-benign  . VAGINAL DELIVERY     x2  . WISDOM TOOTH EXTRACTION     Social History   Social History Narrative   Married, 1 son and 1 daughter   Runner, broadcasting/film/video dairy products   2 caffeine beverages daily   family history includes Brain cancer in her father; Esophageal cancer in her paternal grandmother; Stomach cancer in her mother.   Review of Systems See HPI otherwise negative  Objective:   Physical Exam BP 112/74   Pulse 88   Temp 98.6 F (37 C)   Ht 5\' 7"  (1.702 m)   Wt 160 lb 2 oz (72.6 kg)   LMP 09/29/2015   BMI 25.08 kg/m  Well-developed well-nourished no acute distress  Dana Bernard, [name] present for rectal exam  Anoderm shows some small external hemorrhoid/tag complexes circumferential.  No dermatitis.  Digital rectal exam normal resting tone no mass nontender  Anoscopy is performed demonstrating grade 1 external  hemorrhoids in all positions with increased vascular pattern and mild inflammation.  Grade 1 noninflamed internal hemorrhoids all positions.

## 2019-09-10 ENCOUNTER — Ambulatory Visit (INDEPENDENT_AMBULATORY_CARE_PROVIDER_SITE_OTHER): Payer: Managed Care, Other (non HMO)

## 2019-09-10 ENCOUNTER — Encounter: Payer: Self-pay | Admitting: Orthopaedic Surgery

## 2019-09-10 ENCOUNTER — Other Ambulatory Visit: Payer: Self-pay

## 2019-09-10 ENCOUNTER — Ambulatory Visit: Payer: Managed Care, Other (non HMO) | Admitting: Orthopaedic Surgery

## 2019-09-10 DIAGNOSIS — M7711 Lateral epicondylitis, right elbow: Secondary | ICD-10-CM

## 2019-09-10 DIAGNOSIS — M25521 Pain in right elbow: Secondary | ICD-10-CM

## 2019-09-10 MED ORDER — METHYLPREDNISOLONE ACETATE 40 MG/ML IJ SUSP
40.0000 mg | INTRAMUSCULAR | Status: AC | PRN
  Administered 2019-09-10: 40 mg

## 2019-09-10 MED ORDER — LIDOCAINE HCL 1 % IJ SOLN
1.0000 mL | INTRAMUSCULAR | Status: AC | PRN
  Administered 2019-09-10: 1 mL

## 2019-09-10 NOTE — Progress Notes (Signed)
Office Visit Note   Patient: Dana Bernard           Date of Birth: 07/15/1966           MRN: [id number] Visit Date: 09/10/2019              Requested by: Lois Huxley, [place],  [place] [address] PCP: Lois Huxley, [place]   Assessment & Plan: Visit Diagnoses:  1. Pain in right elbow   2. Lateral epicondylitis, right elbow     Plan: Her signs and symptoms are consistent with lateral epicondylitis.  I recommended a steroid injection over this area as well as Voltaren gel and stretching exercises.  She agrees with this treatment plan.  I described the risk and benefits of steroid injections.  I did place it over the lateral epicondyle area without difficulty.  All question concerns were answered and addressed.  Follow-up can be as needed.  I would not hesitate to repeat injection in 6 to 8 weeks now if needed.  Follow-Up Instructions: Return if symptoms worsen or fail to improve.   Orders:  Orders Placed This Encounter  Procedures  . Hand/UE Inj  . XR Elbow Complete Right (3+View)   No orders of the defined types were placed in this encounter.     Procedures: Hand/UE Inj: R elbow for lateral epicondylitis on 09/10/2019 3:28 PM Medications: 1 mL lidocaine 1 %; 40 mg methylPREDNISolone acetate 40 MG/ML      Clinical Data: No additional findings.   Subjective: Chief Complaint  Patient presents with  . Right Elbow - Pain  Patient is a very pleasant 54 year old female who comes in with right elbow pain is been worsening for about 4 months now after she is doing housework shoveling rocks.  She fell like something pulled in her elbow.  She points to the lateral epicondylar area of the right elbow as a source of her pain.  She is right-hand dominant.  She said the area can be sensitive to the touch.  She has been taking ibuprofen and trying stretching exercises.  She is also tried Aspercreme and none of that is really helped.  She denies any injuries  to her shoulder wrist or hand on the right side.  She denies any numbness and tingling.  HPI  Review of Systems She currently denies any headache, chest pain, shortness of breath, fever, chills, nausea, vomiting  Objective: Vital Signs: LMP 09/29/2015   Physical Exam She is alert and orient x3 and in no acute distress Ortho Exam Examination of her right elbow shows that it is ligamentously stable.  The range of motion is full.  She is tender over the lateral epicondylar area and her signs and symptoms are consistent with lateral epicondylitis. Specialty Comments:  No specialty comments available.  Imaging: XR Elbow Complete Right (3+View)  Result Date: 09/10/2019 3 views of the right elbow show no acute findings.  The elbow is well located with no significant arthritic changes.    PMFS History: Patient Active Problem List   Diagnosis Date Noted  . Hemorrhoids, internal, with bleeding, prolapse and itching 05/11/2014  . Bleeding external hemorrhoids 05/11/2014  . Colon cancer screening 05/11/2014  . Benign neoplasm of spine 11/09/2011  . Vulvar pain 11/09/2011   Past Medical History:  Diagnosis Date  . Allergy   . Anemia    fater 1st child, no recent  . GERD (gastroesophageal reflux disease)   . Hemorrhoids  Family History  Problem Relation Age of Onset  . Brain cancer Father   . Stomach cancer Mother   . Esophageal cancer Paternal Grandmother        heavy smoker  . Colon cancer Neg Hx   . Colon polyps Neg Hx   . Rectal cancer Neg Hx     Past Surgical History:  Procedure Laterality Date  . HEMORRHOID BANDING    . tooth removal     as child with sedation  . TUMOR REMOVAL  2010   of Spine-benign  . VAGINAL DELIVERY     x2  . WISDOM TOOTH EXTRACTION     Social History   Occupational History  . Occupation: Scientist, clinical (histocompatibility and immunogenetics): [employer]  Tobacco Use  . Smoking status: Never Smoker  . Smokeless tobacco: Never Used  Substance and Sexual Activity  .  Alcohol use: Yes    Alcohol/week: 7.0 standard drinks    Types: 7 Standard drinks or equivalent per week    Comment: 1 per day  . Drug use: No  . Sexual activity: Not on file

## 2020-04-16 DIAGNOSIS — N94819 Vulvodynia, unspecified: Secondary | ICD-10-CM | POA: Diagnosis not present

## 2020-04-20 DIAGNOSIS — R030 Elevated blood-pressure reading, without diagnosis of hypertension: Secondary | ICD-10-CM | POA: Diagnosis not present

## 2020-06-11 DIAGNOSIS — R030 Elevated blood-pressure reading, without diagnosis of hypertension: Secondary | ICD-10-CM | POA: Diagnosis not present

## 2020-07-23 DIAGNOSIS — Z20822 Contact with and (suspected) exposure to covid-19: Secondary | ICD-10-CM | POA: Diagnosis not present

## 2020-07-23 DIAGNOSIS — U071 COVID-19: Secondary | ICD-10-CM | POA: Diagnosis not present

## 2020-09-22 DIAGNOSIS — L910 Hypertrophic scar: Secondary | ICD-10-CM | POA: Diagnosis not present

## 2020-09-22 DIAGNOSIS — L820 Inflamed seborrheic keratosis: Secondary | ICD-10-CM | POA: Diagnosis not present

## 2020-09-22 DIAGNOSIS — Z1283 Encounter for screening for malignant neoplasm of skin: Secondary | ICD-10-CM | POA: Diagnosis not present

## 2020-09-22 DIAGNOSIS — D225 Melanocytic nevi of trunk: Secondary | ICD-10-CM | POA: Diagnosis not present

## 2020-10-19 DIAGNOSIS — M549 Dorsalgia, unspecified: Secondary | ICD-10-CM | POA: Diagnosis not present

## 2020-11-19 DIAGNOSIS — N94819 Vulvodynia, unspecified: Secondary | ICD-10-CM | POA: Diagnosis not present

## 2021-02-17 ENCOUNTER — Other Ambulatory Visit: Payer: Self-pay | Admitting: Obstetrics and Gynecology

## 2021-02-17 DIAGNOSIS — Z1231 Encounter for screening mammogram for malignant neoplasm of breast: Secondary | ICD-10-CM

## 2021-02-21 ENCOUNTER — Ambulatory Visit
Admission: RE | Admit: 2021-02-21 | Discharge: 2021-02-21 | Disposition: A | Discharge status: Home / Self Care | Payer: BC Managed Care – PPO | Source: Ambulatory Visit | Attending: Obstetrics and Gynecology | Admitting: Obstetrics and Gynecology

## 2021-02-21 ENCOUNTER — Other Ambulatory Visit: Payer: Self-pay

## 2021-02-21 DIAGNOSIS — Z1231 Encounter for screening mammogram for malignant neoplasm of breast: Secondary | ICD-10-CM

## 2021-03-01 DIAGNOSIS — Z01419 Encounter for gynecological examination (general) (routine) without abnormal findings: Secondary | ICD-10-CM | POA: Diagnosis not present

## 2021-03-01 DIAGNOSIS — Z6826 Body mass index (BMI) 26.0-26.9, adult: Secondary | ICD-10-CM | POA: Diagnosis not present

## 2021-03-10 DIAGNOSIS — M79661 Pain in right lower leg: Secondary | ICD-10-CM | POA: Diagnosis not present

## 2021-03-10 DIAGNOSIS — R030 Elevated blood-pressure reading, without diagnosis of hypertension: Secondary | ICD-10-CM | POA: Diagnosis not present

## 2021-03-11 ENCOUNTER — Ambulatory Visit (HOSPITAL_BASED_OUTPATIENT_CLINIC_OR_DEPARTMENT_OTHER)
Admission: RE | Admit: 2021-03-11 | Discharge: 2021-03-11 | Disposition: A | Discharge status: Home / Self Care | Payer: BC Managed Care – PPO | Source: Ambulatory Visit | Attending: Physician Assistant | Admitting: Physician Assistant

## 2021-03-11 ENCOUNTER — Other Ambulatory Visit: Payer: Self-pay

## 2021-03-11 ENCOUNTER — Other Ambulatory Visit (HOSPITAL_BASED_OUTPATIENT_CLINIC_OR_DEPARTMENT_OTHER): Payer: Self-pay | Admitting: Physician Assistant

## 2021-03-11 DIAGNOSIS — Z87312 Personal history of (healed) stress fracture: Secondary | ICD-10-CM | POA: Diagnosis not present

## 2021-03-11 DIAGNOSIS — M79661 Pain in right lower leg: Secondary | ICD-10-CM | POA: Diagnosis not present

## 2021-03-16 ENCOUNTER — Other Ambulatory Visit: Payer: Self-pay | Admitting: Physician Assistant

## 2021-03-16 DIAGNOSIS — M79661 Pain in right lower leg: Secondary | ICD-10-CM

## 2021-03-24 DIAGNOSIS — Z13220 Encounter for screening for lipoid disorders: Secondary | ICD-10-CM | POA: Diagnosis not present

## 2021-03-24 DIAGNOSIS — N94819 Vulvodynia, unspecified: Secondary | ICD-10-CM | POA: Diagnosis not present

## 2021-03-24 DIAGNOSIS — Z0000 Encounter for general adult medical examination without abnormal findings: Secondary | ICD-10-CM | POA: Diagnosis not present

## 2021-03-24 DIAGNOSIS — G4700 Insomnia, unspecified: Secondary | ICD-10-CM | POA: Diagnosis not present

## 2021-03-27 ENCOUNTER — Ambulatory Visit
Admission: RE | Admit: 2021-03-27 | Discharge: 2021-03-27 | Disposition: A | Discharge status: Home / Self Care | Payer: BC Managed Care – PPO | Source: Ambulatory Visit | Attending: Physician Assistant | Admitting: Physician Assistant

## 2021-03-27 ENCOUNTER — Other Ambulatory Visit: Payer: Self-pay

## 2021-03-27 DIAGNOSIS — M79661 Pain in right lower leg: Secondary | ICD-10-CM

## 2021-09-21 ENCOUNTER — Ambulatory Visit: Payer: Self-pay

## 2021-09-21 ENCOUNTER — Ambulatory Visit: Payer: BC Managed Care – PPO | Admitting: Orthopaedic Surgery

## 2021-09-21 ENCOUNTER — Other Ambulatory Visit: Payer: Self-pay

## 2021-09-21 ENCOUNTER — Encounter: Payer: Self-pay | Admitting: Orthopaedic Surgery

## 2021-09-21 DIAGNOSIS — G8929 Other chronic pain: Secondary | ICD-10-CM | POA: Diagnosis not present

## 2021-09-21 DIAGNOSIS — M5441 Lumbago with sciatica, right side: Secondary | ICD-10-CM

## 2021-09-21 NOTE — Progress Notes (Signed)
Office Visit Note   Patient: Dana Bernard           Date of Birth: Aug 20, 1965           MRN: [id number] Visit Date: 09/21/2021              Requested by: Lois Huxley, [address],  [place] [address] PCP: Lois Huxley, [place]   Assessment & Plan: Visit Diagnoses:  1. Chronic right-sided low back pain with right-sided sciatica     Plan: Given the patient's waking pain and weakness on exam with extension of the left great toe against resistance recommend MRI to evaluate for HNP as a source of her radicular symptoms on the right also to evaluate for advanced facet arthritic changes resulting in stenosis versus facet arthritic changes as the source of her low back pain.  Have her follow-up with [place] after the MRI to go over results discuss further treatment.  Questions were encouraged and answered by Dr. Ninfa Linden and myself.  Follow-Up Instructions: Return AFTER MRI.   Orders:  Orders Placed This Encounter  Procedures   XR Lumbar Spine 2-3 Views   No orders of the defined types were placed in this encounter.     Procedures: No procedures performed   Clinical Data: No additional findings.   Subjective: Chief Complaint  Patient presents with   Lower Back - Pain    HPI Dana Bernard is a 56 year old female who is known to Dr. Ninfa Linden service comes in today with new complaint of low back pain.  She states that she has had low back pain for years at least 7 years.  This been worked up in the past and she has been to formal therapy.  She reports some 4 to 5 years ago she had low back pain and [facility] 6 weeks of therapy there and then went to pain management had 6 weeks of formal therapy there.  She continues to do Pilates which helps some.  However states her back pain is becoming worse it is waking her at night.  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  Ranks her pain to be 7 out of 10 pain at worst.  She  denies any radicular symptoms down the left leg.  Occasional radicular symptoms down the right leg to the knee.  No injury.  She has tried Advil which helps some but does not completely alleviate the pain.  She has had epidural steroid injection in the past which helped for short period of time.  Pain is worse with walking or standing.  Sitting does help with the low back pain. Review of Systems Positive for waking pain.  Negative for bowel bladder dysfunction negative for saddle anesthesia like symptoms.  Objective: Vital Signs: LMP 09/29/2015   Physical Exam Constitutional:      Appearance: She is not ill-appearing or diaphoretic.  Cardiovascular:     Pulses: Normal pulses.  Pulmonary:     Effort: Pulmonary effort is normal.  Neurological:     Mental Status: She is alert and oriented to person, place, and time.  Psychiatric:        Mood and Affect: Mood normal.    Ortho Exam Lumbar spine tenderness left lumbar lower paraspinous region.  Full extension flexion lumbar spine flexion causes discomfort lower spine.  5 out of 5 strength throughout the lower extremities except for extension of the  left great toe against resistance is 4 out of 5.  Sensation grossly intact bilateral feet to light touch.  Excellent range of motion bilateral hips without pain. Specialty Comments:  No specialty comments available.  Imaging: No results found.   PMFS History: Patient Active Problem List   Diagnosis Date Noted   Hemorrhoids, internal, with bleeding, prolapse and itching 05/11/2014   Bleeding external hemorrhoids 05/11/2014   Colon cancer screening 05/11/2014   Benign neoplasm of spine 11/09/2011   Vulvar pain 11/09/2011   Past Medical History:  Diagnosis Date   Allergy    Anemia    fater 1st child, no recent   GERD (gastroesophageal reflux disease)    Hemorrhoids     Family History  Problem Relation Age of Onset   Brain cancer Father    Stomach cancer Mother    Esophageal cancer  Paternal Grandmother        heavy smoker   Colon cancer Neg Hx    Colon polyps Neg Hx    Rectal cancer Neg Hx     Past Surgical History:  Procedure Laterality Date   HEMORRHOID BANDING     tooth removal     as child with sedation   TUMOR REMOVAL  2010   of Spine-benign   VAGINAL DELIVERY     x2   WISDOM TOOTH EXTRACTION     Social History   Occupational History   Occupation: Scientist, clinical (histocompatibility and immunogenetics): [employer]  Tobacco Use   Smoking status: Never   Smokeless tobacco: Never  Vaping Use   Vaping Use: Never used  Substance and Sexual Activity   Alcohol use: Yes    Alcohol/week: 7.0 standard drinks    Types: 7 Standard drinks or equivalent per week    Comment: 1 per day   Drug use: No   Sexual activity: Not on file

## 2021-09-21 NOTE — Addendum Note (Signed)
Addended by: Robyne Peers on: 09/21/2021 11:02 AM   Modules accepted: Orders

## 2021-10-08 ENCOUNTER — Other Ambulatory Visit: Payer: Self-pay

## 2021-10-08 ENCOUNTER — Ambulatory Visit
Admission: RE | Admit: 2021-10-08 | Discharge: 2021-10-08 | Disposition: A | Discharge status: Home / Self Care | Payer: BC Managed Care – PPO | Source: Ambulatory Visit | Attending: Physician Assistant | Admitting: Physician Assistant

## 2021-10-08 DIAGNOSIS — M5137 Other intervertebral disc degeneration, lumbosacral region: Secondary | ICD-10-CM | POA: Diagnosis not present

## 2021-10-08 DIAGNOSIS — G8929 Other chronic pain: Secondary | ICD-10-CM

## 2021-10-08 DIAGNOSIS — M545 Low back pain, unspecified: Secondary | ICD-10-CM | POA: Diagnosis not present

## 2021-10-11 ENCOUNTER — Encounter: Payer: Self-pay | Admitting: Physician Assistant

## 2021-10-11 ENCOUNTER — Ambulatory Visit (INDEPENDENT_AMBULATORY_CARE_PROVIDER_SITE_OTHER): Payer: BC Managed Care – PPO | Admitting: Physician Assistant

## 2021-10-11 DIAGNOSIS — M545 Low back pain, unspecified: Secondary | ICD-10-CM | POA: Diagnosis not present

## 2021-10-11 NOTE — Progress Notes (Signed)
HPI: This returns today for follow-up of her low back pain.  She states pain is worse at the end of the day.  She states she is okay first thing in the morning.  Pain is worse with state.  She continues to do Pilates.  She is taking ibuprofen but is worried about taking too much ibuprofen.  She denies any radicular symptoms down either leg.  She has had epidural steroid injections in her lumbar spine in the past which she states were beneficial.  She did not find therapy beneficial in the past. ?MRI of the lumbar spine dated 10/08/2021 is reviewed with the patient.  Left L4-5 annular fissure but no disc protrusion.  No spinal or foraminal stenosis.  L3-4 mild annular bulge and mild facet disease.  Degenerative disc disease at L5-S1 but no spinal or foraminal stenosis. ? ?Impression: Low back pain without radicular symptoms. ? ?Plan: Recommend return to therapy or epidural steroid injection.  She states again that therapy has not been beneficial for her in the past would like an epidural steroid injection.  Therefore we will send her for possible L4-5 facet injection with Dr. Ernestina Patches.  She will follow-up with [place] 4 weeks see what type of response she had to the injection.  Questions were encouraged and answered at length today. ?

## 2021-10-11 NOTE — Addendum Note (Signed)
Addended by: Robyne Peers on: 10/11/2021 01:53 PM ? ? Modules accepted: Orders ? ?

## 2021-10-19 ENCOUNTER — Telehealth: Payer: Self-pay | Admitting: Physical Medicine and Rehabilitation

## 2021-10-19 NOTE — Telephone Encounter (Signed)
Patient called. She would like an appointment with Dr. Newton.  

## 2021-11-21 ENCOUNTER — Encounter: Payer: Self-pay | Admitting: Physical Medicine and Rehabilitation

## 2021-11-21 ENCOUNTER — Ambulatory Visit: Payer: Self-pay

## 2021-11-21 ENCOUNTER — Ambulatory Visit: Payer: BC Managed Care – PPO | Admitting: Physical Medicine and Rehabilitation

## 2021-11-21 VITALS — BP 127/87 | HR 90

## 2021-11-21 DIAGNOSIS — M47816 Spondylosis without myelopathy or radiculopathy, lumbar region: Secondary | ICD-10-CM | POA: Diagnosis not present

## 2021-11-21 MED ORDER — METHYLPREDNISOLONE ACETATE 80 MG/ML IJ SUSP
80.0000 mg | Freq: Once | INTRAMUSCULAR | Status: AC
  Administered 2021-11-21: 80 mg

## 2021-11-21 NOTE — Progress Notes (Signed)
Pt state lower back pain that travels to her right buttock and posterior thigh. Pt state standing and bending makes the pain worse. Pt state she takes over the counter pain meds to help ease her pain. ? ?Numeric Pain Rating Scale and Functional Assessment ?Average Pain 7 ? ? ?In the last MONTH (on 0-10 scale) has pain interfered with the following? ? ?1. General activity like being  able to carry out your everyday physical activities such as walking, climbing stairs, carrying groceries, or moving a chair?  ?Rating(9) ? ? ?+Driver, -BT, -Dye Allergies. ? ?

## 2021-11-21 NOTE — Patient Instructions (Signed)

## 2021-11-30 ENCOUNTER — Ambulatory Visit: Payer: BC Managed Care – PPO | Admitting: Physician Assistant

## 2021-12-06 NOTE — Procedures (Signed)
Lumbar Facet Joint Intra-Articular Injection(s) with Fluoroscopic Guidance ? ?Patient: Dana Bernard      ?Date of Birth: February 02, 1966 ?MRN: [id number] ?PCP: Lois Huxley, PA      ?Visit Date: 11/21/2021 ?  ?Universal Protocol:    ?Date/Time: 11/21/2021 ? ?Consent Given By: the patient ? ?Position: PRONE  ? ?Additional Comments: ?Vital signs were monitored before and after the procedure. ?Patient was prepped and draped in the usual sterile fashion. ?The correct patient, procedure, and site was verified. ? ? ?Injection Procedure Details:  ?Procedure Site One ?Meds Administered:  ?Meds ordered this encounter  ?Medications  ? methylPREDNISolone acetate (DEPO-MEDROL) injection 80 mg  ?  ? ?Laterality: Bilateral ? ?Location/Site:  ?L4-L5 ? ?Needle size: 22 guage ? ?Needle type: Spinal ? ?Needle Placement: Articular ? ?Findings: ? -Comments: Excellent flow of contrast producing a partial arthrogram. ? ?Procedure Details: ?The fluoroscope beam is vertically oriented in AP, and the inferior recess is visualized beneath the lower pole of the inferior apophyseal process, which represents the target point for needle insertion. When direct visualization is difficult the target point is located at the medial projection of the vertebral pedicle. The region overlying each aforementioned target is locally anesthetized with a 1 to 2 ml. volume of 1% Lidocaine without Epinephrine.  ? ?The spinal needle was inserted into each of the above mentioned facet joints using biplanar fluoroscopic guidance. A 0.25 to 0.5 ml. volume of Isovue-250 was injected and a partial facet joint arthrogram was obtained. A single spot film was obtained of the resulting arthrogram.    One to 1.25 ml of the steroid/anesthetic solution was then injected into each of the facet joints noted above. ? ? ?Additional Comments:  ?The patient tolerated the procedure well ?Dressing: 2 x 2 sterile gauze and Band-Aid ?  ? ?Post-procedure details: ?Patient was observed  during the procedure. ?Post-procedure instructions were reviewed. ? ?Patient left the clinic in stable condition. ? ?

## 2021-12-06 NOTE — Progress Notes (Signed)
? ?[name] - 56 y.o. female MRN [id number]  Date of birth: 04-Nov-1965 ? ?Office Visit Note: ?Visit Date: 11/21/2021 ?PCP: Lois Huxley, PA ?Referred by: Pete Pelt, PA-C ? ?Subjective: ?Chief Complaint  ?Patient presents with  ? Lower Back - Pain  ? Right Thigh - Pain  ? Right Leg - Pain  ? ?HPI:  Dana Bernard is a 56 y.o. female who comes in today at the request of Benita Stabile, PA-C for planned Bilateral  L4-5 Lumbar facet/medial branch block with fluoroscopic guidance.  The patient has failed conservative care including home exercise, medications, time and activity modification.  This injection will be diagnostic and hopefully therapeutic.  Please see requesting physician notes for further details and justification.  Exam has shown concordant pain with facet joint loading. MRI reviewed with images and spine model.  MRI reviewed in the note below. ? ?She is getting some referral into the hip and thigh but nothing past the knee.  Depending on relief with diagnostic facet blocks could look at 1 epidural injection.  MRI does show some endplate reactive changes changes but no nerve compression or stenosis or narrowing. ? ? ? ? ?ROS Otherwise per HPI. ? ?Assessment & Plan: ?Visit Diagnoses:  ?  ICD-10-CM   ?1. Spondylosis without myelopathy or radiculopathy, lumbar region  M47.816 XR C-ARM NO REPORT  ?  Facet Injection  ?  methylPREDNISolone acetate (DEPO-MEDROL) injection 80 mg  ?  ?  ?Plan: No additional findings.  ? ?Meds & Orders:  ?Meds ordered this encounter  ?Medications  ? methylPREDNISolone acetate (DEPO-MEDROL) injection 80 mg  ?  ?Orders Placed This Encounter  ?Procedures  ? Facet Injection  ? XR C-ARM NO REPORT  ?  ?Follow-up: Return if symptoms worsen or fail to improve.  ? ?Procedures: ?No procedures performed  ?Lumbar Facet Joint Intra-Articular Injection(s) with Fluoroscopic Guidance ? ?Patient: Dana Bernard      ?Date of Birth: 09/06/1965 ?MRN: [id number] ?PCP: Lois Huxley,  PA      ?Visit Date: 11/21/2021 ?  ?Universal Protocol:    ?Date/Time: 11/21/2021 ? ?Consent Given By: the patient ? ?Position: PRONE  ? ?Additional Comments: ?Vital signs were monitored before and after the procedure. ?Patient was prepped and draped in the usual sterile fashion. ?The correct patient, procedure, and site was verified. ? ? ?Injection Procedure Details:  ?Procedure Site One ?Meds Administered:  ?Meds ordered this encounter  ?Medications  ? methylPREDNISolone acetate (DEPO-MEDROL) injection 80 mg  ?  ? ?Laterality: Bilateral ? ?Location/Site:  ?L4-L5 ? ?Needle size: 22 guage ? ?Needle type: Spinal ? ?Needle Placement: Articular ? ?Findings: ? -Comments: Excellent flow of contrast producing a partial arthrogram. ? ?Procedure Details: ?The fluoroscope beam is vertically oriented in AP, and the inferior recess is visualized beneath the lower pole of the inferior apophyseal process, which represents the target point for needle insertion. When direct visualization is difficult the target point is located at the medial projection of the vertebral pedicle. The region overlying each aforementioned target is locally anesthetized with a 1 to 2 ml. volume of 1% Lidocaine without Epinephrine.  ? ?The spinal needle was inserted into each of the above mentioned facet joints using biplanar fluoroscopic guidance. A 0.25 to 0.5 ml. volume of Isovue-250 was injected and a partial facet joint arthrogram was obtained. A single spot film was obtained of the resulting arthrogram.    One to 1.25 ml of the steroid/anesthetic solution was then injected into each of  the facet joints noted above. ? ? ?Additional Comments:  ?The patient tolerated the procedure well ?Dressing: 2 x 2 sterile gauze and Band-Aid ?  ? ?Post-procedure details: ?Patient was observed during the procedure. ?Post-procedure instructions were reviewed. ? ?Patient left the clinic in stable condition. ?  ? ?Clinical History: ?MRI LUMBAR SPINE WITHOUT  CONTRAST ?  ?TECHNIQUE: ?Multiplanar, multisequence MR imaging of the lumbar spine was ?performed. No intravenous contrast was administered. ?  ?COMPARISON:  Radiographs 09/21/2021 ?  ?FINDINGS: ?Segmentation: There are five lumbar type vertebral bodies. The last ?full intervertebral disc space is labeled L5-S1. This correlates ?with the lumbar radiographs. ?  ?Alignment:  Normal ?  ?Vertebrae: No bone lesions or fractures. Small hemangiomas noted. ?Endplate reactive changes noted at L5-S1. ?  ?Conus medullaris and cauda equina: Conus extends to the T12 level. ?Conus and cauda equina appear normal. ?  ?Paraspinal and other soft tissues: No significant paraspinal or ?retroperitoneal findings. ?  ?Disc levels: ?  ?L1-2: No significant findings. ?  ?L2-3: Mild facet disease with slight ligamentum flavum thickening ?but no disc protrusions, spinal or foraminal stenosis. ?  ?L3-4: Mild annular bulge and mild facet disease with early lateral ?recess encroachment. No spinal or foraminal stenosis. ?  ?L4-5: Left-sided annular fissure but no disc protrusion, spinal or ?foraminal stenosis. Mild facet disease. ?  ?L5-S1: Degenerative disc disease with disc space narrowing and disc ?desiccation along with endplate reactive changes. No disc ?protrusions, spinal or foraminal stenosis. ?  ?IMPRESSION: ?1. Left-sided annular fissure at L4-5 but no disc protrusion, spinal ?or foraminal stenosis. ?2. Early lateral recess encroachment at L3-4. ?3. Degenerative disc disease at L5-S1 but no spinal or foraminal ?stenosis. ?  ?  ?Electronically Signed ?  By: Marijo Sanes M.D. ?  On: 10/08/2021 19:47  ? ? ? ?Objective:  VS:  HT:    WT:   BMI:     BP:127/87  HR:90bpm  TEMP: ( )  RESP:  ?Physical Exam ?Vitals and nursing note reviewed.  ?Constitutional:   ?   General: She is not in acute distress. ?   Appearance: Normal appearance. She is not ill-appearing.  ?HENT:  ?   Head: Normocephalic and atraumatic.  ?   Right Ear: External ear  normal.  ?   Left Ear: External ear normal.  ?Eyes:  ?   Extraocular Movements: Extraocular movements intact.  ?Cardiovascular:  ?   Rate and Rhythm: Normal rate.  ?   Pulses: Normal pulses.  ?Pulmonary:  ?   Effort: Pulmonary effort is normal. No respiratory distress.  ?Abdominal:  ?   General: There is no distension.  ?   Palpations: Abdomen is soft.  ?Musculoskeletal:     ?   General: Tenderness present.  ?   Cervical back: Neck supple.  ?   Right lower leg: No edema.  ?   Left lower leg: No edema.  ?   Comments: Patient has good distal strength with no pain over the greater trochanters.  No clonus or focal weakness.  ?Skin: ?   Findings: No erythema, lesion or rash.  ?Neurological:  ?   General: No focal deficit present.  ?   Mental Status: She is alert and oriented to person, place, and time.  ?   Sensory: No sensory deficit.  ?   Motor: No weakness or abnormal muscle tone.  ?   Coordination: Coordination normal.  ?Psychiatric:     ?   Mood and Affect: Mood normal.     ?  Behavior: Behavior normal.  ?  ? ?Imaging: ?No results found. ?

## 2021-12-21 ENCOUNTER — Encounter: Payer: Self-pay | Admitting: Physician Assistant

## 2021-12-21 ENCOUNTER — Ambulatory Visit: Payer: BC Managed Care – PPO | Admitting: Physician Assistant

## 2021-12-21 DIAGNOSIS — M545 Low back pain, unspecified: Secondary | ICD-10-CM

## 2021-12-21 NOTE — Progress Notes (Signed)
HPI: Dana Bernard for returns today status post L4-5 articular epidural steroid injection by Dr. Ernestina Patches on 11/21/2021.  States the right side pain is much improved no longer having any radicular symptoms down either leg.  Her main complaint today though is left lower paraspinous pain.  She is having no radicular symptoms down the the left side.  Pains worse with prolonged standing.  She has continued to do Pilates and has began doing the elliptical on exercise bike.  She was takes occasional Advil and this helps some.  She has had no waking pain.  States the pains been present for at least the past couple weeks. MRI did show a left-sided annular fissure at L4-5 but no disc protrusion spinal or foraminal stenosis.  Early lateral recess encroachment at L3-4 without stenosis.  Degenerative disc disease at L5-S1 but again no stenosis. Review of systems: See HPI otherwise negative.  Physical exam: General well-developed well-nourished female no acute distress ambulates without any assistive device. Bilateral lower extremity she has 5 and 5 strength throughout lower extremities against resistance.  Tight hamstrings bilaterally.  Positive straight leg raise on the right negative on the left.   Impression: Low back pain without radicular symptoms   Plan: We will send her for possible left L3-4 facet injection with Dr. Ernestina Patches.  Have her follow-up with [place] pain persist or becomes worse.  She will continue Pilates and core strengthening.  Questions were encouraged and answered

## 2021-12-21 NOTE — Addendum Note (Signed)
Addended by: Robyne Peers on: 12/21/2021 04:58 PM   Modules accepted: Orders

## 2021-12-30 ENCOUNTER — Telehealth: Payer: Self-pay | Admitting: Physical Medicine and Rehabilitation

## 2021-12-30 NOTE — Telephone Encounter (Signed)
Patient returned call asked for a call back. Phone # [phone]

## 2021-12-30 NOTE — Telephone Encounter (Signed)
Patient called needing to schedule an appointment with Dr. Ernestina Patches for her back injection. The number to contact patient is [phone]

## 2022-01-09 ENCOUNTER — Telehealth: Payer: Self-pay | Admitting: Orthopaedic Surgery

## 2022-01-09 ENCOUNTER — Other Ambulatory Visit: Payer: Self-pay | Admitting: Orthopaedic Surgery

## 2022-01-09 MED ORDER — TIZANIDINE HCL 4 MG PO TABS: 4.0000 mg | ORAL_TABLET | Freq: Three times a day (TID) | ORAL | 0 refills | Status: DC | PRN

## 2022-01-09 NOTE — Telephone Encounter (Signed)
Patient called advised she hurt her back last week and asked if Dr. Ninfa Linden would have a muscle relaxer called into her pharmacy? Patient uses  [facility] in [place]. The number to contact patient is [phone]

## 2022-01-24 ENCOUNTER — Ambulatory Visit: Payer: Self-pay

## 2022-01-24 ENCOUNTER — Ambulatory Visit: Payer: BC Managed Care – PPO | Admitting: Physical Medicine and Rehabilitation

## 2022-01-24 ENCOUNTER — Encounter: Payer: Self-pay | Admitting: Physical Medicine and Rehabilitation

## 2022-01-24 VITALS — BP 128/84 | HR 74

## 2022-01-24 DIAGNOSIS — M47816 Spondylosis without myelopathy or radiculopathy, lumbar region: Secondary | ICD-10-CM

## 2022-01-24 MED ORDER — BUPIVACAINE HCL 0.5 % IJ SOLN
3.0000 mL | Freq: Once | INTRAMUSCULAR | Status: AC
  Administered 2022-01-24: 3 mL

## 2022-01-24 NOTE — Progress Notes (Signed)
ROSENA BARTLE - 56 y.o. female MRN [id number]  Date of birth: Jun 11, 1966  Office Visit Note: Visit Date: 01/24/2022 PCP: Lois Huxley, PA Referred by: Lois Huxley, PA  Subjective: Chief Complaint  Patient presents with   Lower Back - Pain   HPI:  AERABELLA GALASSO is a 56 y.o. female who comes in today for planned repeat Left L3-4 and L4-5 Lumbar facet/medial branch block with fluoroscopic guidance.  The patient has failed conservative care including home exercise, medications, time and activity modification.  This injection will be diagnostic and hopefully therapeutic.  Please see requesting physician notes for further details and justification.  Exam shows concordant low back pain with facet joint loading and extension. Patient received more than 80% pain relief from prior injection.     Referring:Gil Clark, PA-C   ROS Otherwise per HPI.  Assessment & Plan: Visit Diagnoses:    ICD-10-CM   1. Spondylosis without myelopathy or radiculopathy, lumbar region  M47.816 XR C-ARM NO REPORT    Facet Injection    bupivacaine (MARCAINE) 0.5 % (with pres) injection 3 mL      Plan: No additional findings.   Meds & Orders:  Meds ordered this encounter  Medications   bupivacaine (MARCAINE) 0.5 % (with pres) injection 3 mL    Orders Placed This Encounter  Procedures   Facet Injection   XR C-ARM NO REPORT    Follow-up: Return for visit to requesting provider as needed.   Procedures: No procedures performed  Lumbar Facet Joint Intra-Articular Injection(s) with Fluoroscopic Guidance  Patient: JOANMARIE TSANG      Date of Birth: January 19, 1966 MRN: [id number] PCP: Lois Huxley, PA      Visit Date: 01/24/2022   Universal Protocol:    Date/Time: 01/24/2022  Consent Given By: the patient  Position: PRONE   Additional Comments: Vital signs were monitored before and after the procedure. Patient was prepped and draped in the usual sterile fashion. The correct patient,  procedure, and site was verified.   Injection Procedure Details:  Procedure Site One Meds Administered:  Meds ordered this encounter  Medications   bupivacaine (MARCAINE) 0.5 % (with pres) injection 3 mL     Laterality: Left  Location/Site:  L3-L4 L4-L5  Needle size: 22 guage  Needle type: Spinal  Needle Placement: Articular  Findings:  -Comments: Excellent flow of contrast producing a partial arthrogram.  Procedure Details: The fluoroscope beam is vertically oriented in AP, and the inferior recess is visualized beneath the lower pole of the inferior apophyseal process, which represents the target point for needle insertion. When direct visualization is difficult the target point is located at the medial projection of the vertebral pedicle. The region overlying each aforementioned target is locally anesthetized with a 1 to 2 ml. volume of 1% Lidocaine without Epinephrine.   The spinal needle was inserted into each of the above mentioned facet joints using biplanar fluoroscopic guidance. A 0.25 to 0.5 ml. volume of Isovue-250 was injected and a partial facet joint arthrogram was obtained. A single spot film was obtained of the resulting arthrogram.    One to 1.25 ml of the steroid/anesthetic solution was then injected into each of the facet joints noted above.   Additional Comments:  The patient tolerated the procedure well Dressing: 2 x 2 sterile gauze and Band-Aid    Post-procedure details: Patient was observed during the procedure. Post-procedure instructions were reviewed.  Patient left the clinic in stable condition.  Clinical History: MRI LUMBAR SPINE WITHOUT CONTRAST   TECHNIQUE: Multiplanar, multisequence MR imaging of the lumbar spine was performed. No intravenous contrast was administered.   COMPARISON:  Radiographs 09/21/2021   FINDINGS: Segmentation: There are five lumbar type vertebral bodies. The last full intervertebral disc space is labeled  L5-S1. This correlates with the lumbar radiographs.   Alignment:  Normal   Vertebrae: No bone lesions or fractures. Small hemangiomas noted. Endplate reactive changes noted at L5-S1.   Conus medullaris and cauda equina: Conus extends to the T12 level. Conus and cauda equina appear normal.   Paraspinal and other soft tissues: No significant paraspinal or retroperitoneal findings.   Disc levels:   L1-2: No significant findings.   L2-3: Mild facet disease with slight ligamentum flavum thickening but no disc protrusions, spinal or foraminal stenosis.   L3-4: Mild annular bulge and mild facet disease with early lateral recess encroachment. No spinal or foraminal stenosis.   L4-5: Left-sided annular fissure but no disc protrusion, spinal or foraminal stenosis. Mild facet disease.   L5-S1: Degenerative disc disease with disc space narrowing and disc desiccation along with endplate reactive changes. No disc protrusions, spinal or foraminal stenosis.   IMPRESSION: 1. Left-sided annular fissure at L4-5 but no disc protrusion, spinal or foraminal stenosis. 2. Early lateral recess encroachment at L3-4. 3. Degenerative disc disease at L5-S1 but no spinal or foraminal stenosis.     Electronically Signed   By: Marijo Sanes M.D.   On: 10/08/2021 19:47     Objective:  VS:  HT:    WT:   BMI:     BP:128/84  HR:74bpm  TEMP: ( )  RESP:  Physical Exam Vitals and nursing note reviewed.  Constitutional:      General: She is not in acute distress.    Appearance: Normal appearance. She is not ill-appearing.  HENT:     Head: Normocephalic and atraumatic.     Right Ear: External ear normal.     Left Ear: External ear normal.  Eyes:     Extraocular Movements: Extraocular movements intact.  Cardiovascular:     Rate and Rhythm: Normal rate.     Pulses: Normal pulses.  Pulmonary:     Effort: Pulmonary effort is normal. No respiratory distress.  Abdominal:     General: There is  no distension.     Palpations: Abdomen is soft.  Musculoskeletal:        General: Tenderness present.     Cervical back: Neck supple.     Right lower leg: No edema.     Left lower leg: No edema.     Comments: Patient has good distal strength with no pain over the greater trochanters.  No clonus or focal weakness.  Skin:    Findings: No erythema, lesion or rash.  Neurological:     General: No focal deficit present.     Mental Status: She is alert and oriented to person, place, and time.     Sensory: No sensory deficit.     Motor: No weakness or abnormal muscle tone.     Coordination: Coordination normal.  Psychiatric:        Mood and Affect: Mood normal.        Behavior: Behavior normal.      Imaging: No results found.

## 2022-01-24 NOTE — Progress Notes (Signed)
Pt state lower back pain that travels to her left side. Pt state standing and bending makes the pain worse. Pt state she takes over the counter pain meds to help ease her pain.  Numeric Pain Rating Scale and Functional Assessment Average Pain 6   In the last MONTH (on 0-10 scale) has pain interfered with the following?  1. General activity like being  able to carry out your everyday physical activities such as walking, climbing stairs, carrying groceries, or moving a chair?  Rating(8)   +Driver, -BT, -Dye Allergies.

## 2022-02-21 NOTE — Procedures (Signed)
Lumbar Facet Joint Intra-Articular Injection(s) with Fluoroscopic Guidance  Patient: Dana Bernard      Date of Birth: [birth year] MRN: [id number] PCP: Lois Huxley, PA      Visit Date: 01/24/2022   Universal Protocol:    Date/Time: 01/24/2022  Consent Given By: the patient  Position: PRONE   Additional Comments: Vital signs were monitored before and after the procedure. Patient was prepped and draped in the usual sterile fashion. The correct patient, procedure, and site was verified.   Injection Procedure Details:  Procedure Site One Meds Administered:  Meds ordered this encounter  Medications   bupivacaine (MARCAINE) 0.5 % (with pres) injection 3 mL     Laterality: Left  Location/Site:  L3-L4 L4-L5  Needle size: 22 guage  Needle type: Spinal  Needle Placement: Articular  Findings:  -Comments: Excellent flow of contrast producing a partial arthrogram.  Procedure Details: The fluoroscope beam is vertically oriented in AP, and the inferior recess is visualized beneath the lower pole of the inferior apophyseal process, which represents the target point for needle insertion. When direct visualization is difficult the target point is located at the medial projection of the vertebral pedicle. The region overlying each aforementioned target is locally anesthetized with a 1 to 2 ml. volume of 1% Lidocaine without Epinephrine.   The spinal needle was inserted into each of the above mentioned facet joints using biplanar fluoroscopic guidance. A 0.25 to 0.5 ml. volume of Isovue-250 was injected and a partial facet joint arthrogram was obtained. A single spot film was obtained of the resulting arthrogram.    One to 1.25 ml of the steroid/anesthetic solution was then injected into each of the facet joints noted above.   Additional Comments:  The patient tolerated the procedure well Dressing: 2 x 2 sterile gauze and Band-Aid    Post-procedure details: Patient was  observed during the procedure. Post-procedure instructions were reviewed.  Patient left the clinic in stable condition.

## 2022-02-27 DIAGNOSIS — J069 Acute upper respiratory infection, unspecified: Secondary | ICD-10-CM | POA: Diagnosis not present

## 2022-03-13 DIAGNOSIS — J209 Acute bronchitis, unspecified: Secondary | ICD-10-CM | POA: Diagnosis not present

## 2022-03-22 ENCOUNTER — Other Ambulatory Visit: Payer: Self-pay | Admitting: Obstetrics and Gynecology

## 2022-03-22 DIAGNOSIS — Z1231 Encounter for screening mammogram for malignant neoplasm of breast: Secondary | ICD-10-CM

## 2022-03-24 ENCOUNTER — Other Ambulatory Visit: Payer: Self-pay | Admitting: Family Medicine

## 2022-03-24 ENCOUNTER — Ambulatory Visit
Admission: RE | Admit: 2022-03-24 | Discharge: 2022-03-24 | Disposition: A | Discharge status: Home / Self Care | Payer: BC Managed Care – PPO | Source: Ambulatory Visit | Attending: Family Medicine | Admitting: Family Medicine

## 2022-03-24 DIAGNOSIS — R059 Cough, unspecified: Secondary | ICD-10-CM | POA: Diagnosis not present

## 2022-04-04 DIAGNOSIS — Z01419 Encounter for gynecological examination (general) (routine) without abnormal findings: Secondary | ICD-10-CM | POA: Diagnosis not present

## 2022-04-04 DIAGNOSIS — Z124 Encounter for screening for malignant neoplasm of cervix: Secondary | ICD-10-CM | POA: Diagnosis not present

## 2022-04-04 DIAGNOSIS — Z6826 Body mass index (BMI) 26.0-26.9, adult: Secondary | ICD-10-CM | POA: Diagnosis not present

## 2022-04-04 DIAGNOSIS — Z1151 Encounter for screening for human papillomavirus (HPV): Secondary | ICD-10-CM | POA: Diagnosis not present

## 2022-04-11 ENCOUNTER — Other Ambulatory Visit: Payer: Self-pay | Admitting: Orthopaedic Surgery

## 2022-04-18 ENCOUNTER — Ambulatory Visit: Payer: BC Managed Care – PPO

## 2022-05-05 DIAGNOSIS — G8929 Other chronic pain: Secondary | ICD-10-CM | POA: Diagnosis not present

## 2022-05-05 DIAGNOSIS — K219 Gastro-esophageal reflux disease without esophagitis: Secondary | ICD-10-CM | POA: Diagnosis not present

## 2022-05-05 DIAGNOSIS — I10 Essential (primary) hypertension: Secondary | ICD-10-CM | POA: Diagnosis not present

## 2022-05-05 DIAGNOSIS — R140 Abdominal distension (gaseous): Secondary | ICD-10-CM | POA: Diagnosis not present

## 2022-05-10 ENCOUNTER — Ambulatory Visit
Admission: RE | Admit: 2022-05-10 | Discharge: 2022-05-10 | Disposition: A | Discharge status: Home / Self Care | Payer: BC Managed Care – PPO | Source: Ambulatory Visit | Attending: Obstetrics and Gynecology | Admitting: Obstetrics and Gynecology

## 2022-05-10 DIAGNOSIS — Z1231 Encounter for screening mammogram for malignant neoplasm of breast: Secondary | ICD-10-CM | POA: Diagnosis not present

## 2022-06-07 DIAGNOSIS — K219 Gastro-esophageal reflux disease without esophagitis: Secondary | ICD-10-CM | POA: Diagnosis not present

## 2022-06-07 DIAGNOSIS — I10 Essential (primary) hypertension: Secondary | ICD-10-CM | POA: Diagnosis not present

## 2022-06-07 DIAGNOSIS — M545 Low back pain, unspecified: Secondary | ICD-10-CM | POA: Diagnosis not present

## 2022-07-28 DIAGNOSIS — Z7689 Persons encountering health services in other specified circumstances: Secondary | ICD-10-CM | POA: Diagnosis not present

## 2022-08-11 DIAGNOSIS — N94819 Vulvodynia, unspecified: Secondary | ICD-10-CM | POA: Diagnosis not present

## 2022-09-25 IMAGING — MR MR LUMBAR SPINE W/O CM
4 of 5 series · 26 of 48 positions shown · non-contrast
Comparison: Radiographs 09/21/2021

CLINICAL DATA: Chronic low back pain.

EXAM:
MRI LUMBAR SPINE WITHOUT CONTRAST
TECHNIQUE: Multiplanar, multisequence MR imaging of the lumbar spine was
performed. No intravenous contrast was administered.

[Series 2: T2 · sagittal · 4.0mm · 0.53mm/px · 6 of 15 slices shown (1 of 2)]
[im 1/15]
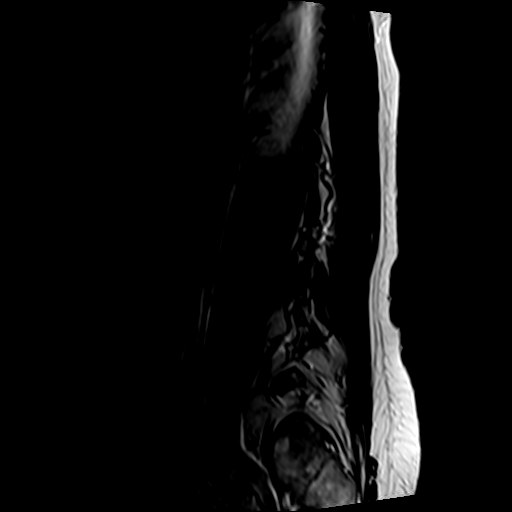
[im 3/15]
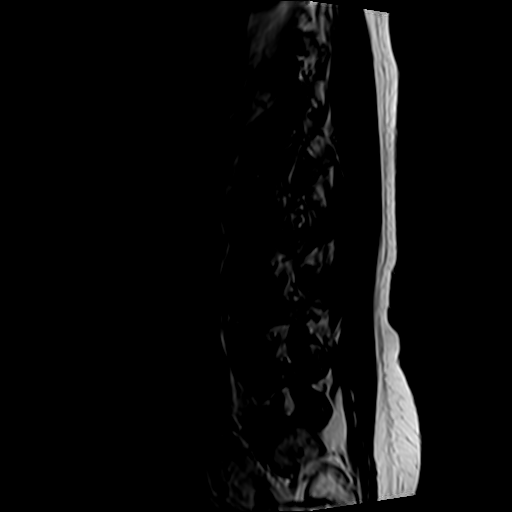
[im 6/15]
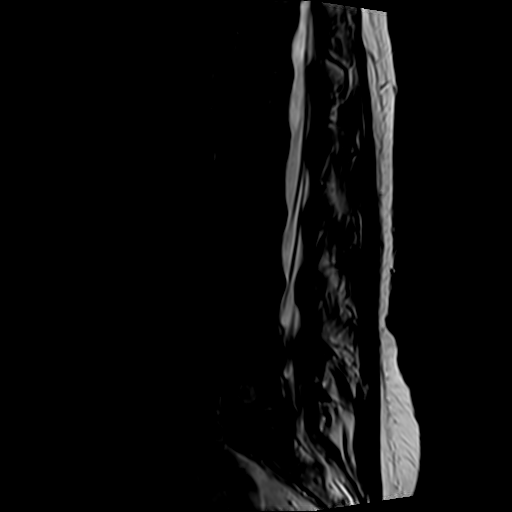
[im 9/15]
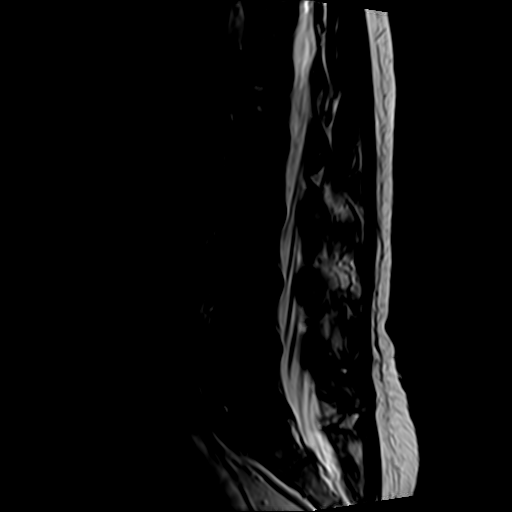
[im 12/15]
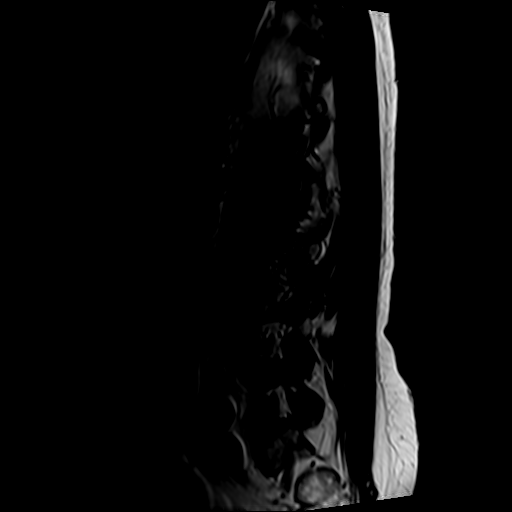
[im 15/15]
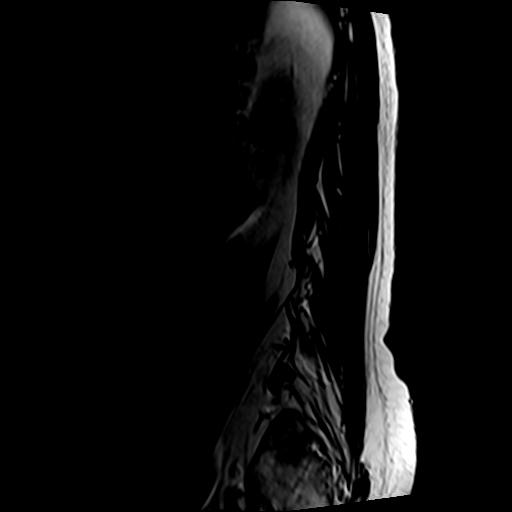

[Series 4: T1 · sagittal · 4.0mm · 0.53mm/px · 6 of 15 slices shown (1 of 2)]
[im 1/15]
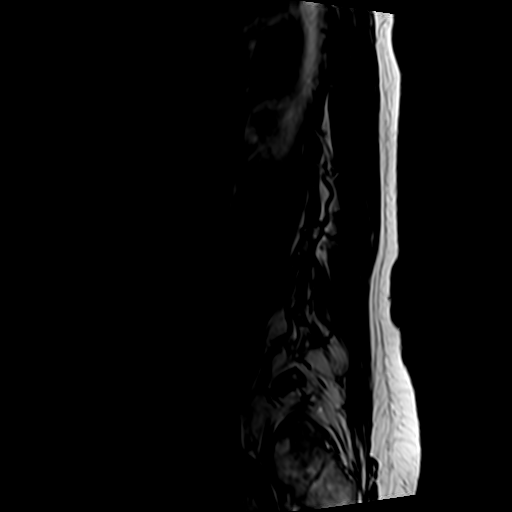
[im 3/15]
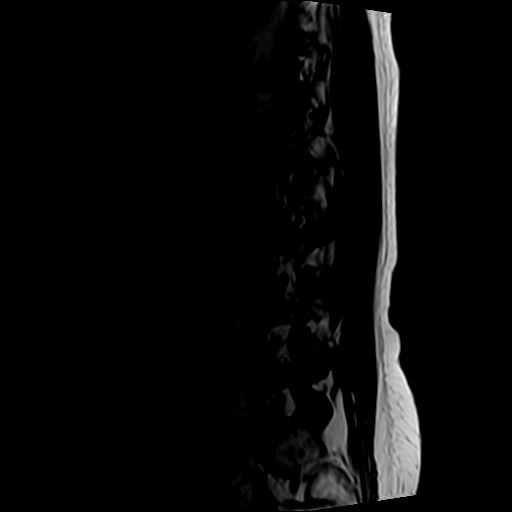
[im 6/15]
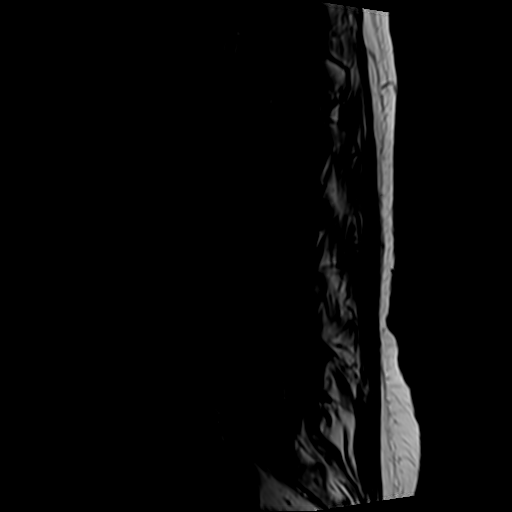
[im 9/15]
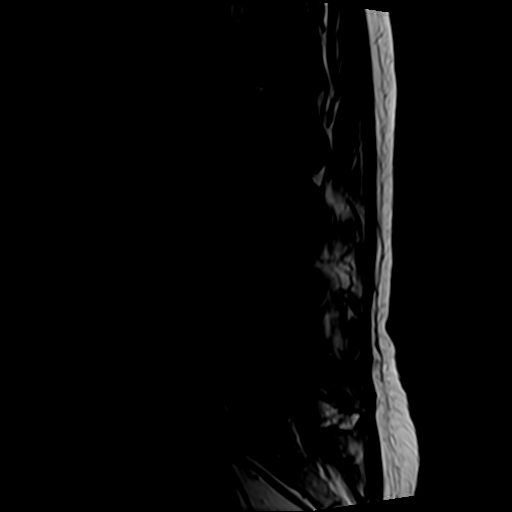
[im 12/15]
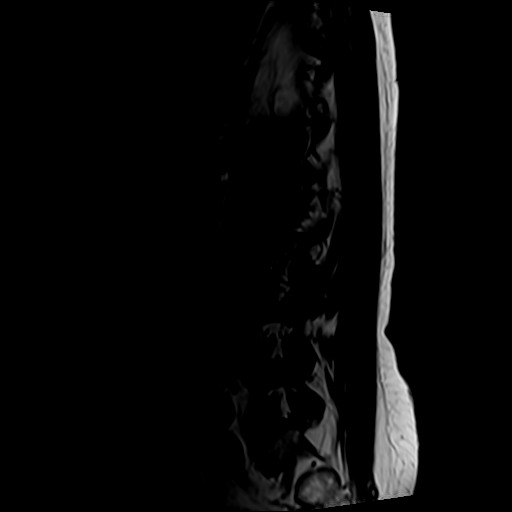
[im 15/15]
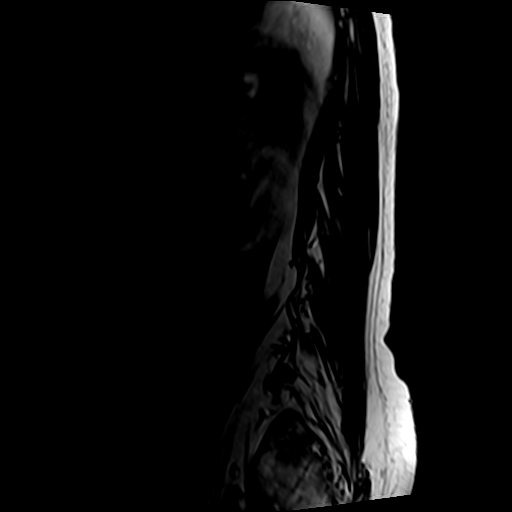

[Series 5: T2 · axial · 4.0mm · 0.70mm/px · z∈[-112,+111]mm · 9 of 40 slices shown (2 of 2)]
[im 1/40]
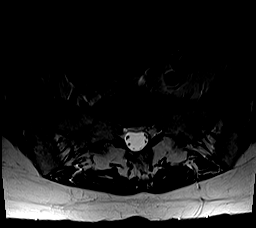
[im 6/40]
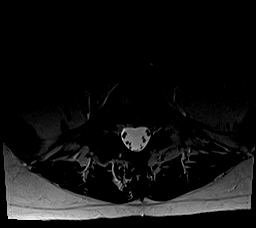
[im 12/40]
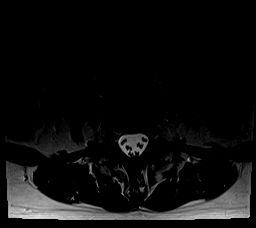
[im 17/40]
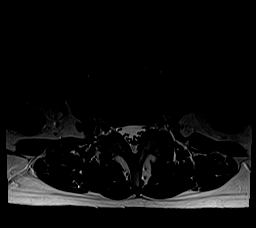
[im 20/40]
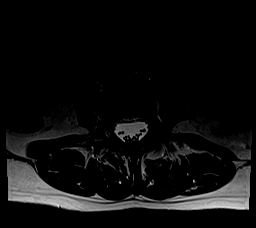
[im 23/40]
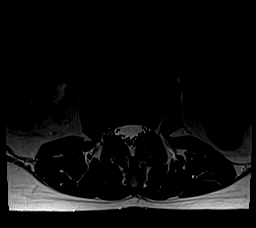
[im 28/40]
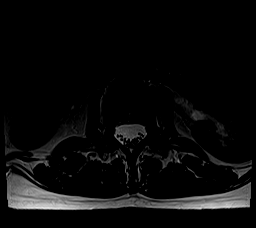
[im 34/40]
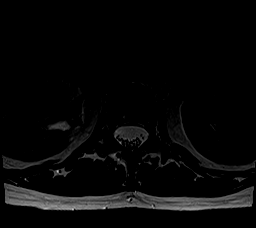
[im 40/40]
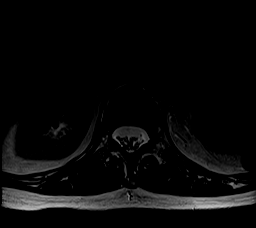

[Series 6: T1 · axial · 4.0mm · 0.35mm/px · z∈[-112,+80]mm · 5 of 40 slices shown (2 of 2)]
[im 1/40]
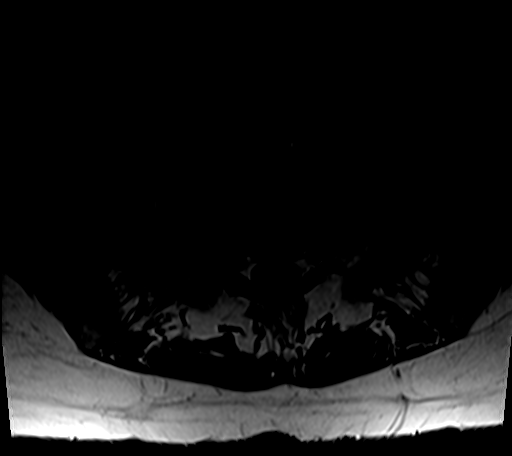
[im 6/40]
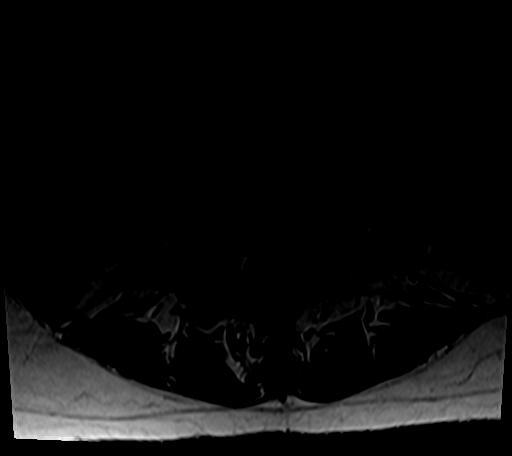
[im 12/40]
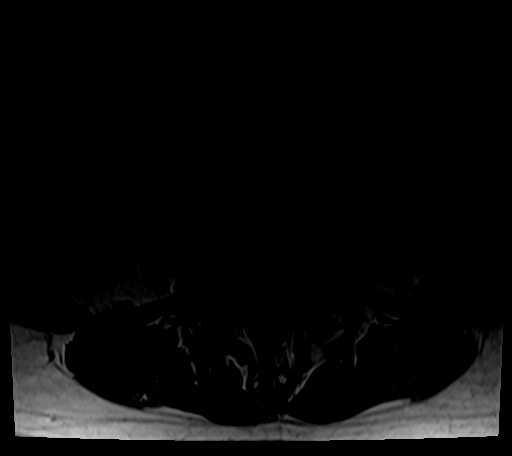
[im 20/40]
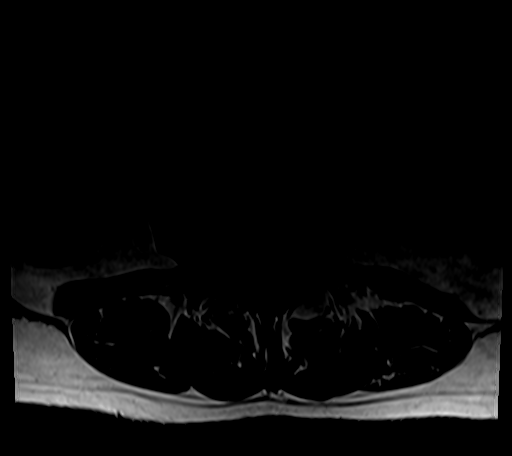
[im 34/40]
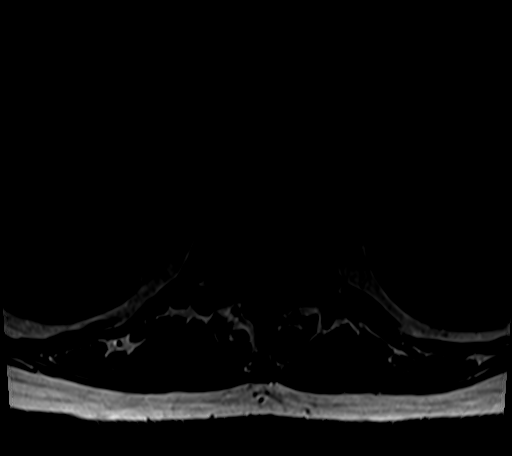

[26 of 48 positions shown; findings below may reference images not displayed]

FINDINGS: Segmentation: There are five lumbar type vertebral bodies. The last
full intervertebral disc space is labeled L5-S1. This correlates
with the lumbar radiographs.

Alignment:  Normal

Vertebrae: No bone lesions or fractures. Small hemangiomas noted.
Endplate reactive changes noted at L5-S1.

Conus medullaris and cauda equina: Conus extends to the T12 level.
Conus and cauda equina appear normal.

Paraspinal and other soft tissues: No significant paraspinal or
retroperitoneal findings.

Disc levels:

L1-2: No significant findings.

L2-3: Mild facet disease with slight ligamentum flavum thickening
but no disc protrusions, spinal or foraminal stenosis.

L3-4: Mild annular bulge and mild facet disease with early lateral
recess encroachment. No spinal or foraminal stenosis.

L4-5: Left-sided annular fissure but no disc protrusion, spinal or
foraminal stenosis. Mild facet disease.

L5-S1: Degenerative disc disease with disc space narrowing and disc
desiccation along with endplate reactive changes. No disc
protrusions, spinal or foraminal stenosis.
IMPRESSION: 1. Left-sided annular fissure at L4-5 but no disc protrusion, spinal
or foraminal stenosis.
2. Early lateral recess encroachment at L3-4.
3. Degenerative disc disease at L5-S1 but no spinal or foraminal
stenosis.

## 2022-10-05 ENCOUNTER — Encounter: Payer: Self-pay | Admitting: Radiology

## 2023-01-05 DIAGNOSIS — N94819 Vulvodynia, unspecified: Secondary | ICD-10-CM | POA: Diagnosis not present

## 2023-04-11 DIAGNOSIS — L820 Inflamed seborrheic keratosis: Secondary | ICD-10-CM | POA: Diagnosis not present

## 2023-04-11 DIAGNOSIS — Z1283 Encounter for screening for malignant neoplasm of skin: Secondary | ICD-10-CM | POA: Diagnosis not present

## 2023-04-11 DIAGNOSIS — K130 Diseases of lips: Secondary | ICD-10-CM | POA: Diagnosis not present

## 2023-04-11 DIAGNOSIS — D225 Melanocytic nevi of trunk: Secondary | ICD-10-CM | POA: Diagnosis not present

## 2023-04-11 DIAGNOSIS — L304 Erythema intertrigo: Secondary | ICD-10-CM | POA: Diagnosis not present

## 2023-05-21 ENCOUNTER — Other Ambulatory Visit (HOSPITAL_BASED_OUTPATIENT_CLINIC_OR_DEPARTMENT_OTHER): Payer: Self-pay | Admitting: Family Medicine

## 2023-05-21 ENCOUNTER — Other Ambulatory Visit (HOSPITAL_BASED_OUTPATIENT_CLINIC_OR_DEPARTMENT_OTHER): Payer: Self-pay | Admitting: Obstetrics and Gynecology

## 2023-05-21 DIAGNOSIS — Z1231 Encounter for screening mammogram for malignant neoplasm of breast: Secondary | ICD-10-CM

## 2023-05-23 DIAGNOSIS — Z1151 Encounter for screening for human papillomavirus (HPV): Secondary | ICD-10-CM | POA: Diagnosis not present

## 2023-05-23 DIAGNOSIS — Z13820 Encounter for screening for osteoporosis: Secondary | ICD-10-CM | POA: Diagnosis not present

## 2023-05-23 DIAGNOSIS — Z124 Encounter for screening for malignant neoplasm of cervix: Secondary | ICD-10-CM | POA: Diagnosis not present

## 2023-05-23 DIAGNOSIS — Z6823 Body mass index (BMI) 23.0-23.9, adult: Secondary | ICD-10-CM | POA: Diagnosis not present

## 2023-05-23 DIAGNOSIS — Z01419 Encounter for gynecological examination (general) (routine) without abnormal findings: Secondary | ICD-10-CM | POA: Diagnosis not present

## 2023-05-28 DIAGNOSIS — Z23 Encounter for immunization: Secondary | ICD-10-CM | POA: Diagnosis not present

## 2023-05-28 DIAGNOSIS — H9201 Otalgia, right ear: Secondary | ICD-10-CM | POA: Diagnosis not present

## 2023-07-02 ENCOUNTER — Ambulatory Visit (HOSPITAL_BASED_OUTPATIENT_CLINIC_OR_DEPARTMENT_OTHER)
Admission: RE | Admit: 2023-07-02 | Discharge: 2023-07-02 | Disposition: A | Discharge status: Home / Self Care | Payer: BC Managed Care – PPO | Source: Ambulatory Visit | Attending: Family Medicine | Admitting: Family Medicine

## 2023-07-02 ENCOUNTER — Encounter (HOSPITAL_BASED_OUTPATIENT_CLINIC_OR_DEPARTMENT_OTHER): Payer: Self-pay

## 2023-07-02 DIAGNOSIS — Z1231 Encounter for screening mammogram for malignant neoplasm of breast: Secondary | ICD-10-CM | POA: Diagnosis not present

## 2023-07-11 ENCOUNTER — Ambulatory Visit: Payer: BC Managed Care – PPO | Admitting: Orthopaedic Surgery

## 2023-07-11 ENCOUNTER — Encounter: Payer: Self-pay | Admitting: Orthopaedic Surgery

## 2023-07-11 ENCOUNTER — Other Ambulatory Visit (INDEPENDENT_AMBULATORY_CARE_PROVIDER_SITE_OTHER): Payer: Self-pay

## 2023-07-11 DIAGNOSIS — M545 Low back pain, unspecified: Secondary | ICD-10-CM | POA: Diagnosis not present

## 2023-07-11 MED ORDER — METHYLPREDNISOLONE 4 MG PO TABS: ORAL_TABLET | ORAL | 0 refills | Status: AC

## 2023-07-11 MED ORDER — TIZANIDINE HCL 4 MG PO TABS: 4.0000 mg | ORAL_TABLET | Freq: Three times a day (TID) | ORAL | 1 refills | Status: AC | PRN

## 2023-07-11 NOTE — Progress Notes (Signed)
Office Visit Note   Patient: Dana Bernard Bernard           Date of Birth: 1966/04/02           MRN: [id number] Visit Date: 07/11/2023              Requested by: Wilfrid Lund, [place] [address] [place],  [place] [address] PCP: Wilfrid Lund, PA   Assessment & Plan: Visit Diagnoses:  1. Low back pain without sciatica, unspecified back pain laterality, unspecified chronicity     Plan: She is very active and does a lot of core strengthening.  Therefore we will have her do some McKenzie exercises.  Placed on medications.  Pain persist or comes worse she will call our office and let us know and we can obtain a new MRI as her symptoms are changed from prior.  Questions were encouraged and answered by Dr. Magnus Ivan myself.  Underwent epidural steroid injection L4-5 articular bilateral by Dr. Alvester Morin in 2023 and did well.  Follow-Up Instructions: Return if symptoms worsen or fail to improve.   Orders:  Orders Placed This Encounter  Procedures   XR Lumbar Spine 2-3 Views   Meds ordered this encounter  Medications   methylPREDNISolone (MEDROL) 4 MG tablet    Sig: Take as directed    Dispense:  21 tablet    Refill:  0   tiZANidine (ZANAFLEX) 4 MG tablet    Sig: Take 1 tablet (4 mg total) by mouth every 8 (eight) hours as needed for muscle spasms.    Dispense:  40 tablet    Refill:  1      Procedures: No procedures performed   Clinical Data: No additional findings.   Subjective: Chief Complaint  Patient presents with   Lower Back - Follow-up    HPI Dana Bernard Bernard is in today with low back pain that radiates into her right hip and buttocks region.  This been ongoing for the past 2 months.  No known injury.  She denies any radicular symptoms down either leg.  However she does state that she has occasional numbness tingling feet.  We saw her in February 2023 due to low back pain with radicular symptoms down the left leg.  She is not having the symptoms.  She is very  active does Pilates and stretching daily.  She denies any fevers, chills, saddle anesthesia like symptoms, waking pain, unintentional weight loss or bowel bladder dysfunction.  Review of Systems  Constitutional:  Negative for chills and fever.  Musculoskeletal:  Positive for back pain.  Neurological:  Positive for numbness.     Objective: Vital Signs: LMP 09/29/2015   Physical Exam Constitutional:      Appearance: She is normal weight. She is not ill-appearing or diaphoretic.  Cardiovascular:     Pulses: Normal pulses.  Pulmonary:     Effort: Pulmonary effort is normal.  Neurological:     Mental Status: She is alert and oriented to person, place, and time.  Psychiatric:        Mood and Affect: Mood normal.     Ortho Exam Lumbar spine: Nontender over the spinal column and paraspinous region.  Bilateral hips good range of motion without pain.  Slight tenderness over the trochanteric region bilaterally.  Negative straight leg raise bilaterally.  She is able to touch her toes and has full extension lumbar spine.  5 out of 5 strength throughout the lower extremities against resistance.  Sensation subjectively  intact bilateral feet to light touch  Specialty Comments:  MRI LUMBAR SPINE WITHOUT CONTRAST   TECHNIQUE: Multiplanar, multisequence MR imaging of the lumbar spine was performed. No intravenous contrast was administered.   COMPARISON:  Radiographs 09/21/2021   FINDINGS: Segmentation: There are five lumbar type vertebral bodies. The last full intervertebral disc space is labeled L5-S1. This correlates with the lumbar radiographs.   Alignment:  Normal   Vertebrae: No bone lesions or fractures. Small hemangiomas noted. Endplate reactive changes noted at L5-S1.   Conus medullaris and cauda equina: Conus extends to the T12 level. Conus and cauda equina appear normal.   Paraspinal and other soft tissues: No significant paraspinal or retroperitoneal findings.   Disc  levels:   L1-2: No significant findings.   L2-3: Mild facet disease with slight ligamentum flavum thickening but no disc protrusions, spinal or foraminal stenosis.   L3-4: Mild annular bulge and mild facet disease with early lateral recess encroachment. No spinal or foraminal stenosis.   L4-5: Left-sided annular fissure but no disc protrusion, spinal or foraminal stenosis. Mild facet disease.   L5-S1: Degenerative disc disease with disc space narrowing and disc desiccation along with endplate reactive changes. No disc protrusions, spinal or foraminal stenosis.   IMPRESSION: 1. Left-sided annular fissure at L4-5 but no disc protrusion, spinal or foraminal stenosis. 2. Early lateral recess encroachment at L3-4. 3. Degenerative disc disease at L5-S1 but no spinal or foraminal stenosis.     Electronically Signed   By: Rudie Meyer M.D.   On: 10/08/2021 19:47  Imaging: XR Lumbar Spine 2-3 Views  Result Date: 07/11/2023 Lumbar spine 2 views: Compared to films 2023 showed no interval change.  There is no spondylolisthesis.  No acute fractures or acute findings.  Mild scoliosis unchanged.  Facet arthritis lower lumbar.    PMFS History: Patient Active Problem List   Diagnosis Date Noted   Hemorrhoids, internal, with bleeding, prolapse and itching 05/11/2014   Bleeding external hemorrhoids 05/11/2014   Colon cancer screening 05/11/2014   Benign neoplasm of spine 11/09/2011   Vulvar pain 11/09/2011   Past Medical History:  Diagnosis Date   Allergy    Anemia    fater 1st child, no recent   GERD (gastroesophageal reflux disease)    Hemorrhoids     Family History  Problem Relation Age of Onset   Brain cancer Father    Stomach cancer Mother    Esophageal cancer Paternal Grandmother        heavy smoker   Colon cancer Neg Hx    Colon polyps Neg Hx    Rectal cancer Neg Hx     Past Surgical History:  Procedure Laterality Date   HEMORRHOID BANDING     tooth removal      as child with sedation   TUMOR REMOVAL  2010   of Spine-benign   VAGINAL DELIVERY     x2   WISDOM TOOTH EXTRACTION     Social History   Occupational History   Occupation: Investment banker, corporate: [employer]  Tobacco Use   Smoking status: Never   Smokeless tobacco: Never  Vaping Use   Vaping status: Never Used  Substance and Sexual Activity   Alcohol use: Yes    Alcohol/week: 7.0 standard drinks of alcohol    Types: 7 Standard drinks or equivalent per week    Comment: 1 per day   Drug use: No   Sexual activity: Not on file

## 2023-11-23 DIAGNOSIS — K219 Gastro-esophageal reflux disease without esophagitis: Secondary | ICD-10-CM | POA: Diagnosis not present

## 2023-11-23 DIAGNOSIS — G4700 Insomnia, unspecified: Secondary | ICD-10-CM | POA: Diagnosis not present

## 2023-11-23 DIAGNOSIS — I10 Essential (primary) hypertension: Secondary | ICD-10-CM | POA: Diagnosis not present

## 2023-12-25 DIAGNOSIS — Z0000 Encounter for general adult medical examination without abnormal findings: Secondary | ICD-10-CM | POA: Diagnosis not present

## 2023-12-25 DIAGNOSIS — I10 Essential (primary) hypertension: Secondary | ICD-10-CM | POA: Diagnosis not present

## 2023-12-25 DIAGNOSIS — Z13220 Encounter for screening for lipoid disorders: Secondary | ICD-10-CM | POA: Diagnosis not present

## 2024-04-25 DIAGNOSIS — N94819 Vulvodynia, unspecified: Secondary | ICD-10-CM | POA: Diagnosis not present

## 2024-06-02 ENCOUNTER — Encounter: Payer: Self-pay | Admitting: Radiology

## 2024-06-02 DIAGNOSIS — Z01419 Encounter for gynecological examination (general) (routine) without abnormal findings: Secondary | ICD-10-CM | POA: Diagnosis not present

## 2024-06-02 DIAGNOSIS — Z124 Encounter for screening for malignant neoplasm of cervix: Secondary | ICD-10-CM | POA: Diagnosis not present

## 2024-06-02 DIAGNOSIS — Z6823 Body mass index (BMI) 23.0-23.9, adult: Secondary | ICD-10-CM | POA: Diagnosis not present

## 2024-06-02 DIAGNOSIS — Z1151 Encounter for screening for human papillomavirus (HPV): Secondary | ICD-10-CM | POA: Diagnosis not present

## 2024-08-12 ENCOUNTER — Other Ambulatory Visit (HOSPITAL_BASED_OUTPATIENT_CLINIC_OR_DEPARTMENT_OTHER): Payer: Self-pay | Admitting: Obstetrics and Gynecology

## 2024-08-12 DIAGNOSIS — Z1231 Encounter for screening mammogram for malignant neoplasm of breast: Secondary | ICD-10-CM

## 2024-08-17 ENCOUNTER — Ambulatory Visit (HOSPITAL_BASED_OUTPATIENT_CLINIC_OR_DEPARTMENT_OTHER)

## 2024-08-18 ENCOUNTER — Ambulatory Visit (HOSPITAL_BASED_OUTPATIENT_CLINIC_OR_DEPARTMENT_OTHER)
Admission: RE | Admit: 2024-08-18 | Discharge: 2024-08-18 | Disposition: A | Source: Ambulatory Visit | Attending: Obstetrics and Gynecology | Admitting: Obstetrics and Gynecology

## 2024-08-18 ENCOUNTER — Encounter (HOSPITAL_BASED_OUTPATIENT_CLINIC_OR_DEPARTMENT_OTHER): Payer: Self-pay

## 2024-08-18 DIAGNOSIS — Z1231 Encounter for screening mammogram for malignant neoplasm of breast: Secondary | ICD-10-CM | POA: Insufficient documentation
